# Patient Record
Sex: Male | Born: 1935 | Hispanic: Yes | Smoking: Former smoker
Health system: Southern US, Community
[De-identification: ages and names within clinical notes are randomized; demographics above are authoritative.]

## PROBLEM LIST (undated history)

## (undated) DIAGNOSIS — R252 Cramp and spasm: Secondary | ICD-10-CM

## (undated) DIAGNOSIS — G5600 Carpal tunnel syndrome, unspecified upper limb: Secondary | ICD-10-CM

## (undated) DIAGNOSIS — I739 Peripheral vascular disease, unspecified: Secondary | ICD-10-CM

## (undated) DIAGNOSIS — M4802 Spinal stenosis, cervical region: Secondary | ICD-10-CM

## (undated) DIAGNOSIS — I10 Essential (primary) hypertension: Secondary | ICD-10-CM

## (undated) DIAGNOSIS — M1990 Unspecified osteoarthritis, unspecified site: Secondary | ICD-10-CM

## (undated) DIAGNOSIS — E785 Hyperlipidemia, unspecified: Secondary | ICD-10-CM

## (undated) DIAGNOSIS — E119 Type 2 diabetes mellitus without complications: Secondary | ICD-10-CM

## (undated) DIAGNOSIS — F329 Major depressive disorder, single episode, unspecified: Secondary | ICD-10-CM

## (undated) DIAGNOSIS — R269 Unspecified abnormalities of gait and mobility: Secondary | ICD-10-CM

## (undated) DIAGNOSIS — F32A Depression, unspecified: Secondary | ICD-10-CM

## (undated) DIAGNOSIS — G709 Myoneural disorder, unspecified: Secondary | ICD-10-CM

## (undated) DIAGNOSIS — T4145XA Adverse effect of unspecified anesthetic, initial encounter: Secondary | ICD-10-CM

## (undated) DIAGNOSIS — I2510 Atherosclerotic heart disease of native coronary artery without angina pectoris: Secondary | ICD-10-CM

## (undated) HISTORY — PX: SHOULDER OPEN ROTATOR CUFF REPAIR: SHX2407

## (undated) HISTORY — DX: Unspecified abnormalities of gait and mobility: R26.9

## (undated) HISTORY — DX: Carpal tunnel syndrome, unspecified upper limb: G56.00

## (undated) HISTORY — DX: Atherosclerotic heart disease of native coronary artery without angina pectoris: I25.10

## (undated) HISTORY — DX: Spinal stenosis, cervical region: M48.02

## (undated) HISTORY — PX: CARPAL TUNNEL RELEASE: SHX101

## (undated) HISTORY — PX: VASCULAR SURGERY: SHX849

## (undated) HISTORY — DX: Hyperlipidemia, unspecified: E78.5

## (undated) HISTORY — DX: Cramp and spasm: R25.2

## (undated) HISTORY — PX: CATARACT EXTRACTION: SUR2

## (undated) HISTORY — DX: Peripheral vascular disease, unspecified: I73.9

---

## 2003-10-16 HISTORY — PX: FEMORAL BYPASS: SHX50

## 2010-01-01 ENCOUNTER — Encounter: Admission: RE | Admit: 2010-01-01 | Discharge: 2010-01-01 | Payer: Self-pay | Admitting: Orthopedic Surgery

## 2010-10-18 ENCOUNTER — Encounter
Admission: RE | Admit: 2010-10-18 | Discharge: 2010-10-18 | Payer: Self-pay | Source: Home / Self Care | Attending: Orthopedic Surgery | Admitting: Orthopedic Surgery

## 2011-05-22 ENCOUNTER — Other Ambulatory Visit (HOSPITAL_COMMUNITY): Payer: Self-pay | Admitting: Family Medicine

## 2011-05-22 DIAGNOSIS — I679 Cerebrovascular disease, unspecified: Secondary | ICD-10-CM

## 2011-05-23 ENCOUNTER — Other Ambulatory Visit: Payer: Self-pay | Admitting: Family Medicine

## 2011-05-23 ENCOUNTER — Ambulatory Visit
Admission: RE | Admit: 2011-05-23 | Discharge: 2011-05-23 | Disposition: A | Discharge status: Home / Self Care | Payer: Medicare Other | Source: Ambulatory Visit | Attending: Family Medicine | Admitting: Family Medicine

## 2011-05-23 VITALS — BP 177/81 | HR 64 | Temp 97.8°F | Resp 16 | Ht 68.0 in | Wt 160.0 lb

## 2011-05-23 DIAGNOSIS — M79604 Pain in right leg: Secondary | ICD-10-CM

## 2011-05-23 DIAGNOSIS — I739 Peripheral vascular disease, unspecified: Secondary | ICD-10-CM

## 2011-05-23 HISTORY — DX: Peripheral vascular disease, unspecified: I73.9
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
## 2011-08-07 ENCOUNTER — Other Ambulatory Visit: Payer: Self-pay | Admitting: Neurosurgery

## 2011-08-07 DIAGNOSIS — M542 Cervicalgia: Secondary | ICD-10-CM

## 2011-08-11 ENCOUNTER — Ambulatory Visit
Admission: RE | Admit: 2011-08-11 | Discharge: 2011-08-11 | Disposition: A | Discharge status: Home / Self Care | Payer: Medicare Other | Source: Ambulatory Visit | Attending: Neurosurgery | Admitting: Neurosurgery

## 2011-08-11 DIAGNOSIS — M542 Cervicalgia: Secondary | ICD-10-CM

## 2011-08-21 ENCOUNTER — Encounter (HOSPITAL_COMMUNITY): Payer: Self-pay | Admitting: Pharmacy Technician

## 2011-08-22 ENCOUNTER — Encounter (HOSPITAL_COMMUNITY)
Admission: RE | Admit: 2011-08-22 | Discharge: 2011-08-22 | Disposition: A | Discharge status: Home / Self Care | Payer: Medicare Other | Source: Ambulatory Visit | Attending: Neurosurgery | Admitting: Neurosurgery

## 2011-08-22 ENCOUNTER — Encounter (HOSPITAL_COMMUNITY): Payer: Self-pay

## 2011-08-22 HISTORY — DX: Myoneural disorder, unspecified: G70.9

## 2011-08-22 HISTORY — DX: Depression, unspecified: F32.A

## 2011-08-22 HISTORY — DX: Essential (primary) hypertension: I10

## 2011-08-22 HISTORY — DX: Major depressive disorder, single episode, unspecified: F32.9

## 2011-08-22 LAB — CBC
MCH: 31.6 pg (ref 26.0–34.0)
MCHC: 34.6 g/dL (ref 30.0–36.0)
MCV: 91.5 fL (ref 78.0–100.0)
Platelets: 280 10*3/uL (ref 150–400)
RBC: 4.14 MIL/uL — ABNORMAL LOW (ref 4.22–5.81)

## 2011-08-22 LAB — BASIC METABOLIC PANEL
CO2: 30 mEq/L (ref 19–32)
Calcium: 10 mg/dL (ref 8.4–10.5)
Creatinine, Ser: 0.98 mg/dL (ref 0.50–1.35)
GFR calc non Af Amer: 72 mL/min — ABNORMAL LOW (ref >90)
Glucose, Bld: 98 mg/dL (ref 70–99)
Sodium: 136 mEq/L (ref 135–145)

## 2011-08-22 LAB — SURGICAL PCR SCREEN: Staphylococcus aureus: NEGATIVE

## 2011-08-22 NOTE — Progress Notes (Signed)
REQUESTED OV NOTE EKG DR HODGES [phone].NO STRESS/ECHO @ [facility].NO CXR PAST 12 MTHS.NEEDS CXR DOS

## 2011-08-22 NOTE — Pre-Procedure Instructions (Signed)
20 Anthony Marsh  08/22/2011   Your procedure is scheduled on: NOV 9 Report to [facility] at 0530 AM.  Call this number if you have problems the morning of surgery: [phone]   Remember:   Do not eat food:After Midnight.  Do not drink clear liquids: 4 Hours before arrival.  Take these medicines the morning of surgery with A SIP OF WATER: ZOLOFT,CARVEDILOL   Do not wear jewelry, make-up or nail polish.  Do not wear lotions, powders, or perfumes. You may wear deodorant.  Do not shave 48 hours prior to surgery.  Do not bring valuables to the hospital.  Contacts, dentures or bridgework may not be worn into surgery.  Leave suitcase in the car. After surgery it may be brought to your room.  For patients admitted to the hospital, checkout time is 11:00 AM the day of discharge.   Patients discharged the day of surgery will not be allowed to drive home.  Name and phone number of your driver: FAMILY\  Special Instructions: CHG Shower Use Special Wash: 1/2 bottle night before surgery and 1/2 bottle morning of surgery.   Please read over the following fact sheets that you were given: Pain Booklet, Coughing and Deep Breathing, MRSA Information and Surgical Site Infection Prevention

## 2011-08-23 MED ORDER — CEFAZOLIN SODIUM 1-5 GM-% IV SOLN: 1.0000 g | INTRAVENOUS | Status: DC

## 2011-08-23 MED ORDER — CEFAZOLIN SODIUM 1-5 GM-% IV SOLN
1.0000 g | INTRAVENOUS | Status: DC
  Filled 2011-08-23: qty 50

## 2011-08-24 ENCOUNTER — Encounter (HOSPITAL_COMMUNITY): Payer: Self-pay | Admitting: Anesthesiology

## 2011-08-24 ENCOUNTER — Inpatient Hospital Stay (HOSPITAL_COMMUNITY)
Admission: RE | Admit: 2011-08-24 | Discharge: 2011-08-26 | DRG: 473 | Disposition: A | Discharge status: Home / Self Care | Payer: Medicare Other | Source: Ambulatory Visit | Attending: Neurosurgery | Admitting: Neurosurgery

## 2011-08-24 ENCOUNTER — Inpatient Hospital Stay (HOSPITAL_COMMUNITY): Payer: Medicare Other | Admitting: Anesthesiology

## 2011-08-24 ENCOUNTER — Encounter (HOSPITAL_COMMUNITY)
Admission: RE | Disposition: A | Discharge status: Home / Self Care | Payer: Self-pay | Source: Ambulatory Visit | Attending: Neurosurgery

## 2011-08-24 ENCOUNTER — Inpatient Hospital Stay (HOSPITAL_COMMUNITY): Payer: Medicare Other

## 2011-08-24 ENCOUNTER — Other Ambulatory Visit: Payer: Self-pay

## 2011-08-24 ENCOUNTER — Encounter (HOSPITAL_COMMUNITY): Payer: Self-pay | Admitting: *Deleted

## 2011-08-24 DIAGNOSIS — E119 Type 2 diabetes mellitus without complications: Secondary | ICD-10-CM | POA: Diagnosis present

## 2011-08-24 DIAGNOSIS — F329 Major depressive disorder, single episode, unspecified: Secondary | ICD-10-CM | POA: Diagnosis present

## 2011-08-24 DIAGNOSIS — I739 Peripheral vascular disease, unspecified: Secondary | ICD-10-CM | POA: Diagnosis present

## 2011-08-24 DIAGNOSIS — I10 Essential (primary) hypertension: Secondary | ICD-10-CM | POA: Diagnosis present

## 2011-08-24 DIAGNOSIS — M4712 Other spondylosis with myelopathy, cervical region: Principal | ICD-10-CM | POA: Diagnosis present

## 2011-08-24 DIAGNOSIS — F3289 Other specified depressive episodes: Secondary | ICD-10-CM | POA: Diagnosis present

## 2011-08-24 DIAGNOSIS — Z01812 Encounter for preprocedural laboratory examination: Secondary | ICD-10-CM

## 2011-08-24 HISTORY — PX: ANTERIOR CERVICAL DECOMP/DISCECTOMY FUSION: SHX1161

## 2011-08-24 LAB — GLUCOSE, CAPILLARY: Glucose-Capillary: 154 mg/dL — ABNORMAL HIGH (ref 70–99)

## 2011-08-24 SURGERY — ANTERIOR CERVICAL DECOMPRESSION/DISCECTOMY FUSION 3 LEVELS
Anesthesia: General | Wound class: Clean

## 2011-08-24 MED ORDER — ZOLPIDEM TARTRATE 5 MG PO TABS: 5.0000 mg | ORAL_TABLET | Freq: Every evening | ORAL | Status: DC | PRN

## 2011-08-24 MED ORDER — DOCUSATE SODIUM 100 MG PO CAPS
100.0000 mg | ORAL_CAPSULE | Freq: Two times a day (BID) | ORAL | Status: DC
  Administered 2011-08-24 – 2011-08-26 (×4): 100 mg via ORAL
  Filled 2011-08-24 (×3): qty 1

## 2011-08-24 MED ORDER — HYDROMORPHONE HCL PF 1 MG/ML IJ SOLN: 0.2500 mg | INTRAMUSCULAR | Status: DC | PRN

## 2011-08-24 MED ORDER — ONDANSETRON HCL 4 MG/2ML IJ SOLN
4.0000 mg | INTRAMUSCULAR | Status: DC | PRN
  Administered 2011-08-24: 4 mg via INTRAVENOUS
  Filled 2011-08-24: qty 2

## 2011-08-24 MED ORDER — FENTANYL CITRATE 0.05 MG/ML IJ SOLN
INTRAMUSCULAR | Status: DC | PRN
  Administered 2011-08-24: 100 ug via INTRAVENOUS
  Administered 2011-08-24 (×2): 50 ug via INTRAVENOUS

## 2011-08-24 MED ORDER — CEFAZOLIN SODIUM 1-5 GM-% IV SOLN
INTRAVENOUS | Status: DC | PRN
  Administered 2011-08-24: 1 g via INTRAVENOUS

## 2011-08-24 MED ORDER — PROPOFOL 10 MG/ML IV EMUL
INTRAVENOUS | Status: DC | PRN
  Administered 2011-08-24: 200 mg via INTRAVENOUS

## 2011-08-24 MED ORDER — ACETAMINOPHEN 650 MG RE SUPP: 650.0000 mg | RECTAL | Status: DC | PRN

## 2011-08-24 MED ORDER — PHENOL 1.4 % MT LIQD
1.0000 | OROMUCOSAL | Status: DC | PRN
  Administered 2011-08-24: 1 via OROMUCOSAL
  Filled 2011-08-24: qty 177

## 2011-08-24 MED ORDER — GLYCOPYRROLATE 0.2 MG/ML IJ SOLN
INTRAMUSCULAR | Status: DC | PRN
  Administered 2011-08-24: 0.2 mg via INTRAVENOUS

## 2011-08-24 MED ORDER — PHENYLEPHRINE HCL 10 MG/ML IJ SOLN
10000.0000 ug | INTRAMUSCULAR | Status: DC | PRN
  Administered 2011-08-24: 10 ug/min via INTRAVENOUS

## 2011-08-24 MED ORDER — THROMBIN 5000 UNITS EX KIT
PACK | OROMUCOSAL | Status: DC | PRN
  Administered 2011-08-24: 15:00:00 via TOPICAL

## 2011-08-24 MED ORDER — SERTRALINE HCL 50 MG PO TABS
50.0000 mg | ORAL_TABLET | Freq: Every day | ORAL | Status: DC
  Administered 2011-08-25 – 2011-08-26 (×2): 50 mg via ORAL
  Filled 2011-08-24 (×3): qty 1

## 2011-08-24 MED ORDER — MORPHINE SULFATE 4 MG/ML IJ SOLN
1.0000 mg | INTRAMUSCULAR | Status: DC | PRN
  Administered 2011-08-24: 4 mg via INTRAVENOUS
  Administered 2011-08-24 – 2011-08-25 (×2): 2 mg via INTRAVENOUS
  Filled 2011-08-24 (×3): qty 1

## 2011-08-24 MED ORDER — DEXAMETHASONE SODIUM PHOSPHATE 4 MG/ML IJ SOLN: 4.0000 mg | Freq: Four times a day (QID) | INTRAMUSCULAR | Status: DC

## 2011-08-24 MED ORDER — ONDANSETRON HCL 4 MG/2ML IJ SOLN: 4.0000 mg | Freq: Once | INTRAMUSCULAR | Status: DC | PRN

## 2011-08-24 MED ORDER — DEXAMETHASONE SODIUM PHOSPHATE 4 MG/ML IJ SOLN
4.0000 mg | Freq: Four times a day (QID) | INTRAMUSCULAR | Status: DC
  Administered 2011-08-24 – 2011-08-26 (×5): 4 mg via INTRAVENOUS
  Filled 2011-08-24 (×7): qty 1

## 2011-08-24 MED ORDER — GELATIN ABSORBABLE 100 EX MISC
CUTANEOUS | Status: DC | PRN
  Administered 2011-08-24: 13:00:00 via TOPICAL

## 2011-08-24 MED ORDER — CARVEDILOL 6.25 MG PO TABS
6.2500 mg | ORAL_TABLET | Freq: Two times a day (BID) | ORAL | Status: DC
  Administered 2011-08-24 – 2011-08-26 (×4): 6.25 mg via ORAL
  Filled 2011-08-24 (×6): qty 1

## 2011-08-24 MED ORDER — ACETAMINOPHEN 325 MG PO TABS: 650.0000 mg | ORAL_TABLET | ORAL | Status: DC | PRN

## 2011-08-24 MED ORDER — CEFAZOLIN SODIUM 1-5 GM-% IV SOLN
1.0000 g | Freq: Three times a day (TID) | INTRAVENOUS | Status: AC
  Administered 2011-08-24 (×2): 1 g via INTRAVENOUS
  Filled 2011-08-24: qty 50

## 2011-08-24 MED ORDER — ROCURONIUM BROMIDE 100 MG/10ML IV SOLN
INTRAVENOUS | Status: DC | PRN
  Administered 2011-08-24: 10 mg via INTRAVENOUS
  Administered 2011-08-24: 50 mg via INTRAVENOUS

## 2011-08-24 MED ORDER — ALUM & MAG HYDROXIDE-SIMETH 400-400-40 MG/5ML PO SUSP
30.0000 mL | Freq: Four times a day (QID) | ORAL | Status: DC | PRN
  Filled 2011-08-24: qty 30

## 2011-08-24 MED ORDER — DEXAMETHASONE 4 MG PO TABS: 4.0000 mg | ORAL_TABLET | Freq: Four times a day (QID) | ORAL | Status: DC

## 2011-08-24 MED ORDER — HYDROCHLOROTHIAZIDE 50 MG PO TABS
50.0000 mg | ORAL_TABLET | Freq: Every day | ORAL | Status: DC
  Administered 2011-08-24 – 2011-08-26 (×3): 50 mg via ORAL
  Filled 2011-08-24 (×3): qty 1

## 2011-08-24 MED ORDER — LACTATED RINGERS IV SOLN
INTRAVENOUS | Status: DC | PRN
  Administered 2011-08-24 (×4): via INTRAVENOUS

## 2011-08-24 MED ORDER — HYDROCODONE-ACETAMINOPHEN 5-325 MG PO TABS
1.0000 | ORAL_TABLET | ORAL | Status: DC | PRN
  Administered 2011-08-24 – 2011-08-26 (×5): 2 via ORAL
  Filled 2011-08-24 (×5): qty 2

## 2011-08-24 MED ORDER — ONDANSETRON HCL 4 MG/2ML IJ SOLN
INTRAMUSCULAR | Status: DC | PRN
  Administered 2011-08-24: 4 mg via INTRAVENOUS

## 2011-08-24 MED ORDER — DEXAMETHASONE 4 MG PO TABS
4.0000 mg | ORAL_TABLET | Freq: Four times a day (QID) | ORAL | Status: DC
  Administered 2011-08-25 (×2): 4 mg via ORAL
  Filled 2011-08-24 (×7): qty 1

## 2011-08-24 MED ORDER — SODIUM CHLORIDE 0.9 % IR SOLN
Status: DC | PRN
  Administered 2011-08-24: 1000 mL

## 2011-08-24 MED ORDER — POTASSIUM CHLORIDE IN NACL 20-0.9 MEQ/L-% IV SOLN
INTRAVENOUS | Status: DC
  Administered 2011-08-24: 18:00:00 via INTRAVENOUS
  Filled 2011-08-24 (×6): qty 1000

## 2011-08-24 MED ORDER — MENTHOL 3 MG MT LOZG
1.0000 | LOZENGE | OROMUCOSAL | Status: DC | PRN
  Filled 2011-08-24: qty 9

## 2011-08-24 MED ORDER — ALUM & MAG HYDROXIDE-SIMETH 200-200-20 MG/5ML PO SUSP: 30.0000 mL | Freq: Four times a day (QID) | ORAL | Status: DC | PRN

## 2011-08-24 SURGICAL SUPPLY — 67 items
BANDAGE GAUZE ELAST BULKY 4 IN (GAUZE/BANDAGES/DRESSINGS) ×4 IMPLANT
BENZOIN TINCTURE PRP APPL 2/3 (GAUZE/BANDAGES/DRESSINGS) ×2 IMPLANT
BIT DRILL SM SPINE QC 12 (BIT) ×2 IMPLANT
BLADE ULTRA TIP 2M (BLADE) ×2 IMPLANT
BUR BARREL STRAIGHT FLUTE 4.0 (BURR) IMPLANT
BUR MATCHSTICK NEURO 3.0 LAGG (BURR) ×2 IMPLANT
CANISTER SUCTION 2500CC (MISCELLANEOUS) ×2 IMPLANT
CLOSURE STERI STRIP 1/2 X4 (GAUZE/BANDAGES/DRESSINGS) ×2 IMPLANT
CLOTH BEACON ORANGE TIMEOUT ST (SAFETY) ×2 IMPLANT
CONT SPEC 4OZ CLIKSEAL STRL BL (MISCELLANEOUS) ×2 IMPLANT
COVER MAYO STAND STRL (DRAPES) ×2 IMPLANT
DRAPE LAPAROTOMY 100X72 PEDS (DRAPES) ×2 IMPLANT
DRAPE MICROSCOPE LEICA (MISCELLANEOUS) ×2 IMPLANT
DRAPE POUCH INSTRU U-SHP 10X18 (DRAPES) ×2 IMPLANT
DRAPE PROXIMA HALF (DRAPES) IMPLANT
DURAPREP 6ML APPLICATOR 50/CS (WOUND CARE) ×2 IMPLANT
ELECT REM PT RETURN 9FT ADLT (ELECTROSURGICAL) ×2
ELECTRODE REM PT RTRN 9FT ADLT (ELECTROSURGICAL) ×1 IMPLANT
GAUZE SPONGE 4X4 16PLY XRAY LF (GAUZE/BANDAGES/DRESSINGS) IMPLANT
GLOVE BIO SURGEON STRL SZ 6.5 (GLOVE) IMPLANT
GLOVE BIO SURGEON STRL SZ7 (GLOVE) IMPLANT
GLOVE BIO SURGEON STRL SZ7.5 (GLOVE) IMPLANT
GLOVE BIO SURGEON STRL SZ8 (GLOVE) IMPLANT
GLOVE BIO SURGEON STRL SZ8.5 (GLOVE) IMPLANT
GLOVE BIOGEL M 8.0 STRL (GLOVE) ×2 IMPLANT
GLOVE ECLIPSE 6.5 STRL STRAW (GLOVE) IMPLANT
GLOVE ECLIPSE 7.0 STRL STRAW (GLOVE) IMPLANT
GLOVE ECLIPSE 7.5 STRL STRAW (GLOVE) ×2 IMPLANT
GLOVE ECLIPSE 8.0 STRL XLNG CF (GLOVE) IMPLANT
GLOVE ECLIPSE 8.5 STRL (GLOVE) IMPLANT
GLOVE EXAM NITRILE LRG STRL (GLOVE) IMPLANT
GLOVE EXAM NITRILE MD LF STRL (GLOVE) IMPLANT
GLOVE EXAM NITRILE XL STR (GLOVE) IMPLANT
GLOVE EXAM NITRILE XS STR PU (GLOVE) IMPLANT
GLOVE INDICATOR 6.5 STRL GRN (GLOVE) IMPLANT
GLOVE INDICATOR 7.0 STRL GRN (GLOVE) IMPLANT
GLOVE INDICATOR 7.5 STRL GRN (GLOVE) IMPLANT
GLOVE INDICATOR 8.0 STRL GRN (GLOVE) IMPLANT
GLOVE INDICATOR 8.5 STRL (GLOVE) IMPLANT
GLOVE OPTIFIT SS 8.0 STRL (GLOVE) IMPLANT
GLOVE OPTIFIT SS STER SZ 7 (GLOVE) IMPLANT
GLOVE SURG SS PI 6.5 STRL IVOR (GLOVE) IMPLANT
GOWN BRE IMP SLV AUR LG STRL (GOWN DISPOSABLE) ×4 IMPLANT
GOWN BRE IMP SLV AUR XL STRL (GOWN DISPOSABLE) ×2 IMPLANT
GOWN STRL REIN 2XL LVL4 (GOWN DISPOSABLE) IMPLANT
KIT BASIN OR (CUSTOM PROCEDURE TRAY) ×2 IMPLANT
KIT ROOM TURNOVER OR (KITS) ×2 IMPLANT
NEEDLE SPNL 22GX3.5 QUINCKE BK (NEEDLE) ×2 IMPLANT
NS IRRIG 1000ML POUR BTL (IV SOLUTION) ×2 IMPLANT
PACK LAMINECTOMY NEURO (CUSTOM PROCEDURE TRAY) ×2 IMPLANT
PATTIES SURGICAL .5 X1 (DISPOSABLE) ×2 IMPLANT
PLATE ANT CERV XTEND 3 LV 45 (Plate) ×2 IMPLANT
PUTTY DBX 1CC (Putty) ×2 IMPLANT
PUTTY DBX 1CC DEPUY (Putty) ×1 IMPLANT
RUBBERBAND STERILE (MISCELLANEOUS) ×4 IMPLANT
SCREW XTD VAR 4.2 SELF TAP 12 (Screw) ×2 IMPLANT
SPACER ACDF SM LORDOTIC 7 (Spacer) ×2 IMPLANT
SPONGE GAUZE 4X4 12PLY (GAUZE/BANDAGES/DRESSINGS) ×2 IMPLANT
SPONGE INTESTINAL PEANUT (DISPOSABLE) ×4 IMPLANT
SPONGE SURGIFOAM ABS GEL 100 (HEMOSTASIS) ×2 IMPLANT
STRIP CLOSURE SKIN 1/2X4 (GAUZE/BANDAGES/DRESSINGS) ×2 IMPLANT
SUT VIC AB 3-0 SH 8-18 (SUTURE) ×4 IMPLANT
SYR 20ML ECCENTRIC (SYRINGE) ×2 IMPLANT
TAPE HYPAFIX 4 X10 (GAUZE/BANDAGES/DRESSINGS) ×2 IMPLANT
TOWEL OR 17X24 6PK STRL BLUE (TOWEL DISPOSABLE) ×2 IMPLANT
TOWEL OR 17X26 10 PK STRL BLUE (TOWEL DISPOSABLE) ×2 IMPLANT
WATER STERILE IRR 1000ML POUR (IV SOLUTION) ×2 IMPLANT

## 2011-08-24 NOTE — Transfer of Care (Signed)
Immediate Anesthesia Transfer of Care Note  Patient: Anthony Marsh  Procedure(s) Performed:  ANTERIOR CERVICAL DECOMPRESSION/DISCECTOMY FUSION 3 LEVELS - C4-5, C5-6, C6-7 ANTERIOR CERVICAL DISKECTOMY, FUSION, PLATE  Patient Location: PACU  Anesthesia Type: General  Level of Consciousness: awake, alert , oriented, sedated and responds to stimulation  Airway & Oxygen Therapy: Patient Spontanous Breathing and Patient connected to face mask  Post-op Assessment: Report given to PACU RN, Post -op Vital signs reviewed and stable, Patient moving all extremities X 4 and Patient able to stick tongue midline  Post vital signs: Reviewed and stable  Complications: No apparent anesthesia complications

## 2011-08-24 NOTE — Anesthesia Preprocedure Evaluation (Signed)
Anesthesia Evaluation  Patient identified by MRN, date of birth, ID band Patient awake    Reviewed: Allergy & Precautions, H&P , NPO status , Patient's Chart, lab work & pertinent test results  Airway Mallampati: II TM Distance: >3 FB Neck ROM: limited    Dental  (+) Poor Dentition and Chipped   Pulmonary neg pulmonary ROS,    Pulmonary exam normal       Cardiovascular Exercise Tolerance: Good hypertension, + Peripheral Vascular Disease regular Normal    Neuro/Psych PSYCHIATRIC DISORDERS  Neuromuscular disease    GI/Hepatic negative GI ROS, Neg liver ROS,   Endo/Other  Diabetes mellitus-, Well Controlled, Type 2  Renal/GU   Genitourinary negative   Musculoskeletal   Abdominal   Peds  Hematology negative hematology ROS (+)   Anesthesia Other Findings   Reproductive/Obstetrics                           Anesthesia Physical Anesthesia Plan  ASA: III  Anesthesia Plan: General   Post-op Pain Management:    Induction: Intravenous  Airway Management Planned: Oral ETT  Additional Equipment:   Intra-op Plan:   Post-operative Plan: Extubation in OR  Informed Consent: I have reviewed the patients History and Physical, chart, labs and discussed the procedure including the risks, benefits and alternatives for the proposed anesthesia with the patient or authorized representative who has indicated his/her understanding and acceptance.   Dental advisory given  Plan Discussed with: CRNA, Anesthesiologist and Surgeon  Anesthesia Plan Comments:         Anesthesia Quick Evaluation
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
## 2011-08-24 NOTE — Anesthesia Postprocedure Evaluation (Signed)
  Anesthesia Post-op Note  Patient: Anthony Marsh  Procedure(s) Performed:  ANTERIOR CERVICAL DECOMPRESSION/DISCECTOMY FUSION 3 LEVELS - C4-5, C5-6, C6-7 ANTERIOR CERVICAL DISKECTOMY, FUSION, PLATE  Patient Location: PACU  Anesthesia Type: General  Level of Consciousness: awake, oriented and sedated  Airway and Oxygen Therapy: Patient Spontanous Breathing and Patient connected to nasal cannula oxygen  Post-op Pain: mild  Post-op Assessment: Post-op Vital signs reviewed, Patient's Cardiovascular Status Stable, Respiratory Function Stable, Patent Airway, No signs of Nausea or vomiting and Pain level controlled  Post-op Vital Signs: stable  Complications: No apparent anesthesia complications

## 2011-08-24 NOTE — Progress Notes (Signed)
Orthopedic Tech Progress Note Patient Details:  Anthony Marsh 29-Mar-1923 [id number]  Other Ortho Devices Ortho Device Location: soft collar Ortho Device Interventions: Application   Jennye Moccasin 08/24/2011, 4:13 PM

## 2011-08-24 NOTE — Brief Op Note (Signed)
08/24/2011  3:28 PM  PATIENT:  Anthony Marsh  77 y.o. male  PRE-OPERATIVE DIAGNOSIS:  CERVICAL SPONDYLOSIS WITH MYELOPATHY, CERVICAL STENOSIS  POST-OPERATIVE DIAGNOSIS:  cervical spondylosis with myelopathy, cervical stenosis  PROCEDURE:  Procedure(s): ANTERIOR CERVICAL DECOMPRESSION/DISCECTOMY FUSION 3 LEVELS  SURGEON:  Surgeon(s): Lynne Logan M Alayah Knouff Clydene Fake  PHYSICIAN ASSISTANT:   ASSISTANTS:   ANESTHESIA:   general  EBL:  Total I/O In: 2000 [I.V.:2000] Out: 200 [Blood:200]  BLOOD ADMINISTERED:none  DRAINSJackson-Pratt drain(s) with closed bulb suction in the neck   LOCAL MEDICATIONS USED:  NONE  SPECIMEN:  No Specimen  DISPOSITION OF SPECIMEN:  N/A  COUNTS:  YES  TOURNIQUET:  * No tourniquets in log *  DICTATION: .Other Dictation: Dictation Number [id number]  PLAN OF CARE: Admit to inpatient   PATIENT DISPOSITION:  PACU - hemodynamically stable.   Delay start of Pharmacological VTE agent (>24hrs) due to surgical blood loss or risk of bleeding:  NO

## 2011-08-25 LAB — GLUCOSE, CAPILLARY
Glucose-Capillary: 124 mg/dL — ABNORMAL HIGH (ref 70–99)
Glucose-Capillary: 166 mg/dL — ABNORMAL HIGH (ref 70–99)
Glucose-Capillary: 144 mg/dL — ABNORMAL HIGH (ref 70–99)
Glucose-Capillary: 159 mg/dL — ABNORMAL HIGH (ref 70–99)

## 2011-08-25 MED ORDER — SODIUM CHLORIDE 0.9 % IJ SOLN: 3.0000 mL | INTRAMUSCULAR | Status: DC | PRN

## 2011-08-25 NOTE — Progress Notes (Signed)
Stable,right deltoids 0/5. No pain. jp drain working well. Swallows well. Incision dry. Seen by pt/ot. Less imbalance. Decrease of burning sensation in both arms.
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
## 2011-08-25 NOTE — Progress Notes (Signed)
  Doing well. C/o appropriate incisional soreness. No arm pain No Numbness, tingling,- stable decrease ROM Right arm No Nausea /vomiting Amb/ voiding well  Temp:  [97 F (36.1 C)-97.8 F (36.6 C)] 97.5 F (36.4 C) (11/10 0600) Pulse Rate:  [58-85] 66  (11/10 0600) Resp:  [9-24] 20  (11/10 0600) BP: (120-186)/(57-72) 124/62 mmHg (11/10 0600) SpO2:  [96 %-100 %] 96 % (11/10 0600) Weight:  [71.215 kg (157 lb)] 157 lb (71.215 kg) (11/09 1722) Good strength and sensation Incision CDI Drain - 130 cc last 12 hours with sig drainage in drain now  Plan: Keep drain another day
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
## 2011-08-26 MED ORDER — HYDROCODONE-ACETAMINOPHEN 5-325 MG PO TABS: 1.0000 | ORAL_TABLET | ORAL | Status: DC | PRN

## 2011-08-26 NOTE — Discharge Summary (Signed)
Physician Discharge Summary  Patient ID: Anthony Marsh MRN: [id number] DOB/AGE: 75-Jan-1924 75 y.o.  Admit date: 08/24/2011 Discharge date: 08/26/2011  Admission Diagnoses:  Discharge Diagnoses:  Active Problems:  * No active hospital problems. *    Discharged Condition: good  Hospital Course: Doing well. C/o appropriate incisional soreness.  No arm pain  No Numbness, tingling,- stable decrease ROM Right arm  No Nausea /vomiting  Amb/ voiding well Drain removed.   Consults: none  Significant Diagnostic Studies: none  Treatments: surgery: C45,66 decompresin and fusion with cages and plate   Discharge Exam: Blood pressure 157/53, pulse 61, temperature 97.4 F (36.3 C), temperature source Oral, resp. rate 18, height 5\' 8"  (1.727 m), weight 71.215 kg (157 lb), SpO2 98.00%. Wound:c/d/i  Disposition: home   Current Discharge Medication List    START taking these medications   Details  HYDROcodone-acetaminophen (NORCO) 5-325 MG per tablet Take 1-2 tablets by mouth every 4 (four) hours as needed. Qty: 41 tablet, Refills: 0      CONTINUE these medications which have NOT CHANGED   Details  carvedilol (COREG) 6.25 MG tablet Take 6.25 mg by mouth 2 (two) times daily with a meal. Pt takes this medication at 8 am and 8 pm     sertraline (ZOLOFT) 100 MG tablet Take 50 mg by mouth daily.      cholecalciferol (VITAMIN D) 1000 UNITS tablet Take 1,000 Units by mouth daily.      clopidogrel (PLAVIX) 75 MG tablet Take 75 mg by mouth daily.      hydrochlorothiazide 50 MG tablet Take 50 mg by mouth daily.      Multiple Vitamins-Minerals (MULTIVITAMINS THER. W/MINERALS) TABS Take 1 tablet by mouth daily.      simvastatin (ZOCOR) 40 MG tablet Take 40 mg by mouth at bedtime.     vitamin C (ASCORBIC ACID) 500 MG tablet Take 500 mg by mouth daily.           Signed: Clydene Fake, MD 08/26/2011, 9:25 AM

## 2011-08-26 NOTE — Progress Notes (Signed)
Pt discharged home in stable condition with all belongings and family. Medications and discharge instructions reviewed prior to discharge.  

## 2011-08-27 ENCOUNTER — Other Ambulatory Visit: Payer: Self-pay | Admitting: Neurological Surgery

## 2011-08-27 ENCOUNTER — Encounter (HOSPITAL_COMMUNITY): Payer: Self-pay | Admitting: *Deleted

## 2011-08-27 ENCOUNTER — Inpatient Hospital Stay (HOSPITAL_COMMUNITY)
Admission: EM | Admit: 2011-08-27 | Discharge: 2011-08-30 | DRG: 081 | Disposition: A | Discharge status: Home / Self Care | Payer: Medicare Other | Source: Other Acute Inpatient Hospital | Attending: Neurosurgery | Admitting: Neurosurgery

## 2011-08-27 ENCOUNTER — Inpatient Hospital Stay (HOSPITAL_COMMUNITY): Payer: Medicare Other

## 2011-08-27 DIAGNOSIS — E876 Hypokalemia: Secondary | ICD-10-CM | POA: Diagnosis present

## 2011-08-27 DIAGNOSIS — F3289 Other specified depressive episodes: Secondary | ICD-10-CM | POA: Diagnosis present

## 2011-08-27 DIAGNOSIS — E785 Hyperlipidemia, unspecified: Secondary | ICD-10-CM | POA: Diagnosis present

## 2011-08-27 DIAGNOSIS — Z7902 Long term (current) use of antithrombotics/antiplatelets: Secondary | ICD-10-CM

## 2011-08-27 DIAGNOSIS — R410 Disorientation, unspecified: Secondary | ICD-10-CM | POA: Diagnosis present

## 2011-08-27 DIAGNOSIS — R404 Transient alteration of awareness: Principal | ICD-10-CM | POA: Diagnosis present

## 2011-08-27 DIAGNOSIS — Z79899 Other long term (current) drug therapy: Secondary | ICD-10-CM

## 2011-08-27 DIAGNOSIS — F329 Major depressive disorder, single episode, unspecified: Secondary | ICD-10-CM | POA: Diagnosis present

## 2011-08-27 DIAGNOSIS — Z87891 Personal history of nicotine dependence: Secondary | ICD-10-CM

## 2011-08-27 DIAGNOSIS — I739 Peripheral vascular disease, unspecified: Secondary | ICD-10-CM | POA: Insufficient documentation

## 2011-08-27 DIAGNOSIS — I10 Essential (primary) hypertension: Secondary | ICD-10-CM | POA: Diagnosis present

## 2011-08-27 DIAGNOSIS — E860 Dehydration: Secondary | ICD-10-CM | POA: Diagnosis present

## 2011-08-27 DIAGNOSIS — Z794 Long term (current) use of insulin: Secondary | ICD-10-CM

## 2011-08-27 DIAGNOSIS — M4802 Spinal stenosis, cervical region: Secondary | ICD-10-CM | POA: Insufficient documentation

## 2011-08-27 DIAGNOSIS — E119 Type 2 diabetes mellitus without complications: Secondary | ICD-10-CM | POA: Diagnosis present

## 2011-08-27 LAB — COMPREHENSIVE METABOLIC PANEL
ALT: 15 U/L (ref 0–53)
AST: 34 U/L (ref 0–37)
Albumin: 3.3 g/dL — ABNORMAL LOW (ref 3.5–5.2)
Alkaline Phosphatase: 60 U/L (ref 39–117)
Calcium: 9.2 mg/dL (ref 8.4–10.5)
GFR calc Af Amer: >90 mL/min (ref >90)
Potassium: 2.9 mEq/L — ABNORMAL LOW (ref 3.5–5.1)
Sodium: 138 mEq/L (ref 135–145)
Total Protein: 6.6 g/dL (ref 6.0–8.3)

## 2011-08-27 LAB — CBC
Hemoglobin: 10.6 g/dL — ABNORMAL LOW (ref 13.0–17.0)
MCH: 31.4 pg (ref 26.0–34.0)
MCHC: 34.2 g/dL (ref 30.0–36.0)
Platelets: 251 10*3/uL (ref 150–400)
RDW: 12.9 % (ref 11.5–15.5)

## 2011-08-27 LAB — URINALYSIS, ROUTINE W REFLEX MICROSCOPIC
Bilirubin Urine: NEGATIVE
Glucose, UA: NEGATIVE mg/dL
Ketones, ur: NEGATIVE mg/dL
Nitrite: NEGATIVE
Specific Gravity, Urine: 1.044 — ABNORMAL HIGH (ref 1.005–1.030)
pH: 6.5 (ref 5.0–8.0)

## 2011-08-27 LAB — GLUCOSE, CAPILLARY: Glucose-Capillary: 112 mg/dL — ABNORMAL HIGH (ref 70–99)

## 2011-08-27 MED ORDER — CARVEDILOL 6.25 MG PO TABS
6.2500 mg | ORAL_TABLET | Freq: Two times a day (BID) | ORAL | Status: DC
  Administered 2011-08-27 – 2011-08-30 (×6): 6.25 mg via ORAL
  Filled 2011-08-27 (×9): qty 1

## 2011-08-27 MED ORDER — VITAMIN D3 25 MCG (1000 UNIT) PO TABS
1000.0000 [IU] | ORAL_TABLET | Freq: Every day | ORAL | Status: DC
  Administered 2011-08-28 – 2011-08-30 (×3): 1000 [IU] via ORAL
  Filled 2011-08-27 (×4): qty 1

## 2011-08-27 MED ORDER — SIMVASTATIN 40 MG PO TABS
40.0000 mg | ORAL_TABLET | Freq: Every day | ORAL | Status: DC
  Administered 2011-08-27: 40 mg via ORAL
  Filled 2011-08-27 (×2): qty 1

## 2011-08-27 MED ORDER — VITAMIN C 500 MG PO TABS
500.0000 mg | ORAL_TABLET | Freq: Every day | ORAL | Status: DC
  Administered 2011-08-28 – 2011-08-30 (×3): 500 mg via ORAL
  Filled 2011-08-27 (×4): qty 1

## 2011-08-27 MED ORDER — INSULIN ASPART 100 UNIT/ML ~~LOC~~ SOLN: 0.0000 [IU] | Freq: Every day | SUBCUTANEOUS | Status: DC

## 2011-08-27 MED ORDER — INSULIN ASPART 100 UNIT/ML ~~LOC~~ SOLN
0.0000 [IU] | Freq: Three times a day (TID) | SUBCUTANEOUS | Status: DC
  Filled 2011-08-27: qty 3

## 2011-08-27 MED ORDER — PHENOL 1.4 % MT LIQD: 1.0000 | OROMUCOSAL | Status: DC | PRN

## 2011-08-27 MED ORDER — LORAZEPAM 2 MG/ML IJ SOLN
1.0000 mg | INTRAMUSCULAR | Status: DC | PRN
  Administered 2011-08-28: 1 mg via INTRAVENOUS
  Filled 2011-08-27: qty 1

## 2011-08-27 MED ORDER — POTASSIUM CHLORIDE CRYS ER 20 MEQ PO TBCR
20.0000 meq | EXTENDED_RELEASE_TABLET | Freq: Three times a day (TID) | ORAL | Status: DC
  Administered 2011-08-27 – 2011-08-30 (×8): 20 meq via ORAL
  Filled 2011-08-27 (×11): qty 1

## 2011-08-27 MED ORDER — SODIUM CHLORIDE 0.9 % IJ SOLN
3.0000 mL | Freq: Two times a day (BID) | INTRAMUSCULAR | Status: DC
  Administered 2011-08-27 – 2011-08-29 (×4): 3 mL via INTRAVENOUS

## 2011-08-27 MED ORDER — ACETAMINOPHEN 650 MG RE SUPP: 650.0000 mg | RECTAL | Status: DC | PRN

## 2011-08-27 MED ORDER — IOHEXOL 300 MG/ML  SOLN
80.0000 mL | Freq: Once | INTRAMUSCULAR | Status: AC | PRN
  Administered 2011-08-27: 80 mL via INTRAVENOUS

## 2011-08-27 MED ORDER — ACETAMINOPHEN 325 MG PO TABS: 650.0000 mg | ORAL_TABLET | ORAL | Status: DC | PRN

## 2011-08-27 MED ORDER — SERTRALINE HCL 50 MG PO TABS
50.0000 mg | ORAL_TABLET | Freq: Every day | ORAL | Status: DC
  Administered 2011-08-28 – 2011-08-30 (×3): 50 mg via ORAL
  Filled 2011-08-27 (×4): qty 1

## 2011-08-27 MED ORDER — THERA M PLUS PO TABS
1.0000 | ORAL_TABLET | Freq: Every day | ORAL | Status: DC
  Administered 2011-08-28 – 2011-08-30 (×3): 1 via ORAL
  Filled 2011-08-27 (×4): qty 1

## 2011-08-27 MED ORDER — ONDANSETRON HCL 4 MG/2ML IJ SOLN: 4.0000 mg | INTRAMUSCULAR | Status: DC | PRN

## 2011-08-27 MED ORDER — HYDROCODONE-ACETAMINOPHEN 5-325 MG PO TABS: 1.0000 | ORAL_TABLET | ORAL | Status: DC | PRN

## 2011-08-27 MED ORDER — MENTHOL 3 MG MT LOZG
1.0000 | LOZENGE | OROMUCOSAL | Status: DC | PRN
  Filled 2011-08-27: qty 9

## 2011-08-27 MED ORDER — POTASSIUM CHLORIDE 20 MEQ PO PACK: 20.0000 meq | PACK | Freq: Three times a day (TID) | ORAL | Status: DC

## 2011-08-27 MED ORDER — HYDROCHLOROTHIAZIDE 50 MG PO TABS
50.0000 mg | ORAL_TABLET | Freq: Every day | ORAL | Status: DC
  Filled 2011-08-27 (×2): qty 1

## 2011-08-27 MED ORDER — DOCUSATE SODIUM 100 MG PO CAPS
100.0000 mg | ORAL_CAPSULE | Freq: Two times a day (BID) | ORAL | Status: DC
  Administered 2011-08-27 – 2011-08-29 (×4): 100 mg via ORAL
  Filled 2011-08-27 (×4): qty 1

## 2011-08-27 MED ORDER — LABETALOL HCL 5 MG/ML IV SOLN
10.0000 mg | Freq: Once | INTRAVENOUS | Status: AC
  Administered 2011-08-27: 10 mg via INTRAVENOUS
  Filled 2011-08-27: qty 4

## 2011-08-27 MED ORDER — POTASSIUM CHLORIDE CRYS ER 20 MEQ PO TBCR
20.0000 meq | EXTENDED_RELEASE_TABLET | Freq: Three times a day (TID) | ORAL | Status: DC
  Filled 2011-08-27 (×2): qty 1
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
## 2011-08-27 NOTE — Progress Notes (Signed)
Subjective: Patient reports no pain. confusion Objective: Vital signs in last 24 hours: Temp:  [98.4 F (36.9 C)] 98.4 F (36.9 C) (11/12 0631) Pulse Rate:  [76-99] 76  (11/12 0631) Resp:  [18] 18  (11/12 0631) BP: (154-206)/(69-79) 167/69 mmHg (11/12 0631) SpO2:  [90 %-97 %] 97 % (11/12 0631)  Intake/Output from previous day: 11/11 0701 - 11/12 0700 In: -  Out: 200 [Urine:200] Intake/Output this shift:      Lab Results:  [facility] 08/27/11 0620  WBC 13.6*  HGB 10.6*  HCT 31.0*  PLT 251   BMET  Basename 08/27/11 0620  NA 138  K 2.9*  CL 99  CO2 30  GLUCOSE 123*  BUN 24*  CREATININE 0.82  CALCIUM 9.2    Studies/Results: No results found.  Assessment/Plan: discharge yesterday by dr Phoebe Perch. Readmitted because confusion.wound witd small drainage. Weakness od right deltoids. Spoke with daughter. Plan is to observe him. Will get a ct head  LOS: 0 days     Desiree Fleming M 08/27/2011, 9:43 AM

## 2011-08-27 NOTE — Progress Notes (Signed)
PT Cancellation Note  Treatment cancelled today due to medical issues with patient which prohibited therapy--pt with increased confusion/hallucinations and agitation with security called to assist with returning pt to room.  On later attempt to see pt, pt gone for CT.   Will assess need for PT evaluation 08/28/11.   08/27/2011 Veda Canning, PT Pager: [phone]

## 2011-08-27 NOTE — Progress Notes (Signed)
No further acute OT needs, will sign off with recommendation for pt to have 24/7 Supervision and HHOT. Did communicate with his nurse that the patient's daughter reports he had a CT scan at [facility] yesterday and so the nurse is contacting Dr. Jeral Fruit to let him know.  In addition, pt is having difficulty swallowing even liquids--says his through hurts. Daughter reports that he did not eat anything yesterday and only two bites of breakfast this morning. Also witnessed thin liquids (Ensure and water causing him to cough).  Ignacia Palma, OTR/L [phone]

## 2011-08-27 NOTE — H&P (Signed)
  Subjective: Patient reports acute confusion/ hallucinations. S/P neck surgery 2 days ago by Jeral Fruit. D/Ced earlier today, now re-admitted after being seen at [facility] ER.  Objective: Vital signs in last 24 hours: Temp:  [97.4 F (36.3 C)-98.4 F (36.9 C)] 98.4 F (36.9 C) (11/12 0135) Pulse Rate:  [61-97] 97  (11/12 0135) Resp:  [18] 18  (11/12 0135) BP: (136-206)/(53-74) 206/74 mmHg (11/12 0135) SpO2:  [90 %-98 %] 90 % (11/12 0135)  Intake/Output from previous day: 11/11 0701 - 11/12 0700 In: -  Out: 200 [Urine:200] Intake/Output this shift: Total I/O In: -  Out: 200 [Urine:200]  Lab Results: Lab Results  Component Value Date   WBC 9.8 08/22/2011   HGB 13.1 08/22/2011   HCT 37.9* 08/22/2011   MCV 91.5 08/22/2011   PLT 280 08/22/2011   No results found for this basename: INR, PROTIME   BMET Lab Results  Component Value Date   NA 136 08/22/2011   K 4.0 08/22/2011   CL 97 08/22/2011   CO2 30 08/22/2011   GLUCOSE 98 08/22/2011   BUN 27* 08/22/2011   CREATININE 0.98 08/22/2011   CALCIUM 10.0 08/22/2011    Studies/Results: No results found.  Assessment/Plan: Acute delirium, cause unknown  LOS: 0 days    Lochlan Grygiel S 08/27/2011, 1:50 AM
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
## 2011-08-27 NOTE — Plan of Care (Signed)
Problem: Phase I Progression Outcomes Goal: OOB as tolerated unless otherwise ordered Outcome: Completed/Met Date Met:  08/27/11 Pt up and moving by himself in the room and ambulated in the halls with OT.

## 2011-08-28 ENCOUNTER — Inpatient Hospital Stay (HOSPITAL_COMMUNITY): Payer: Medicare Other

## 2011-08-28 DIAGNOSIS — R4182 Altered mental status, unspecified: Secondary | ICD-10-CM

## 2011-08-28 LAB — DIFFERENTIAL
Basophils Absolute: 0 10*3/uL (ref 0.0–0.1)
Lymphocytes Relative: 14 % (ref 12–46)
Lymphs Abs: 1.8 10*3/uL (ref 0.7–4.0)
Monocytes Absolute: 1.6 10*3/uL — ABNORMAL HIGH (ref 0.1–1.0)
Neutro Abs: 9.7 10*3/uL — ABNORMAL HIGH (ref 1.7–7.7)

## 2011-08-28 LAB — CBC
HCT: 33.5 % — ABNORMAL LOW (ref 39.0–52.0)
Platelets: 303 10*3/uL (ref 150–400)
RBC: 3.64 MIL/uL — ABNORMAL LOW (ref 4.22–5.81)
RDW: 13 % (ref 11.5–15.5)
WBC: 13.1 10*3/uL — ABNORMAL HIGH (ref 4.0–10.5)

## 2011-08-28 LAB — BASIC METABOLIC PANEL
CO2: 28 mEq/L (ref 19–32)
Chloride: 101 mEq/L (ref 96–112)
Sodium: 139 mEq/L (ref 135–145)

## 2011-08-28 LAB — GLUCOSE, CAPILLARY
Glucose-Capillary: 115 mg/dL — ABNORMAL HIGH (ref 70–99)
Glucose-Capillary: 171 mg/dL — ABNORMAL HIGH (ref 70–99)

## 2011-08-28 LAB — HEMOGLOBIN A1C: Hgb A1c MFr Bld: 6.1 % — ABNORMAL HIGH (ref <5.7)

## 2011-08-28 LAB — BLOOD GAS, ARTERIAL
Drawn by: 31996
FIO2: 0.21 %
Patient temperature: 98.6
pH, Arterial: 7.371 (ref 7.350–7.450)

## 2011-08-28 LAB — URINE CULTURE
Colony Count: 30000
Culture  Setup Time: 201211130157

## 2011-08-28 MED ORDER — FLUMAZENIL 0.5 MG/5ML IV SOLN
INTRAVENOUS | Status: AC
  Administered 2011-08-28: 0.5 mg via INTRAVENOUS
  Filled 2011-08-28: qty 5

## 2011-08-28 MED ORDER — FLUMAZENIL 0.5 MG/5ML IV SOLN
0.5000 mg | INTRAVENOUS | Status: DC | PRN
  Administered 2011-08-28: 0.5 mg via INTRAVENOUS

## 2011-08-28 MED ORDER — KCL-LACTATED RINGERS 20 MEQ/L IV SOLN
INTRAVENOUS | Status: DC
  Administered 2011-08-28: 11:00:00 via INTRAVENOUS
  Filled 2011-08-28: qty 1000

## 2011-08-28 MED ORDER — FLUMAZENIL 0.5 MG/5ML IV SOLN
0.5000 mg | Freq: Once | INTRAVENOUS | Status: AC
  Administered 2011-08-28: 0.5 mg via INTRAVENOUS

## 2011-08-28 MED ORDER — SODIUM CHLORIDE 0.9 % IV SOLN
INTRAVENOUS | Status: DC
  Administered 2011-08-28 – 2011-08-29 (×2): via INTRAVENOUS
  Filled 2011-08-28 (×3): qty 1000

## 2011-08-28 MED ORDER — HYDROCHLOROTHIAZIDE 25 MG PO TABS
25.0000 mg | ORAL_TABLET | Freq: Every day | ORAL | Status: DC
Start: 2011-08-28 — End: 2011-08-30
  Administered 2011-08-30: 25 mg via ORAL
  Filled 2011-08-28 (×3): qty 1

## 2011-08-28 MED ORDER — AMLODIPINE BESYLATE 5 MG PO TABS
5.0000 mg | ORAL_TABLET | Freq: Every day | ORAL | Status: DC
  Administered 2011-08-28 – 2011-08-29 (×2): 5 mg via ORAL
  Filled 2011-08-28 (×3): qty 1

## 2011-08-28 MED ORDER — ROSUVASTATIN CALCIUM 10 MG PO TABS
10.0000 mg | ORAL_TABLET | Freq: Every day | ORAL | Status: DC
  Administered 2011-08-28 – 2011-08-29 (×2): 10 mg via ORAL
  Filled 2011-08-28 (×3): qty 1

## 2011-08-28 NOTE — Significant Event (Signed)
Patient received 1mg  IV Ativan for extreme agitation, fighting, biting, kicking & within 20 min became lethargic, response to deep stim only with decreasing O2 sat / ability to manage saliva.  Dr Jordan Likes notified and new orders received for Flumazenil 0.5 mg IV, repeated x 1 due to continued inability to manage saliva.  Periods of apnea resembling sleep apnea once second dose given.  Continuous pulse ox 93-100% on 4 L humidified O2.  Dr Jordan Likes notified of continued issues / progress.  Continuous monitoring and observation by staff & Rapid  Respnose RN.  Stabalized, VSS.

## 2011-08-28 NOTE — Progress Notes (Signed)
Subjective: Patient reports  Stable.more awake.oriented x 2. eating Objective: Vital signs in last 24 hours: Temp:  [97.5 F (36.4 C)-97.9 F (36.6 C)] 97.8 F (36.6 C) (11/13 0500) Pulse Rate:  [59-80] 80  (11/13 0925) Resp:  [17-20] 17  (11/13 0500) BP: (158-171)/(73-83) 158/73 mmHg (11/13 0500) SpO2:  [92 %-100 %] 99 % (11/13 0925)  Intake/Output from previous day: 11/12 0701 - 11/13 0700 In: 240 [P.O.:240] Out: 500 [Urine:500] Intake/Output this shift:    Stabvle.moves all 4 limbs. Still deltoids 0-1/5 f/c well  Wounds dry  Lab Results:  [facility] 08/28/11 0640 08/27/11 0620  WBC 13.1* 13.6*  HGB 11.4* 10.6*  HCT 33.5* 31.0*  PLT 303 251   BMET  Basename 08/28/11 0640 08/27/11 0620  NA 139 138  K 3.6 2.9*  CL 101 99  CO2 28 30  GLUCOSE 120* 123*  BUN 25* 24*  CREATININE 0.72 0.82  CALCIUM 9.4 9.2    Studies/Results: Ct Head W Wo Contrast  08/27/2011  *RADIOLOGY REPORT*  Clinical Data: Recent discharge for cervical spine surgery, now with hallucinations and confusion.  CT HEAD WITHOUT AND WITH CONTRAST  Technique:  Contiguous axial images were obtained from the base of the skull through the vertex without and with intravenous contrast.  Contrast: 80mL OMNIPAQUE IOHEXOL 300 MG/ML IV SOLN  Comparison: None.  Findings: There is no evidence for acute stroke, intracranial hemorrhage, mass lesion, or hydrocephalus.  Bilateral CSF like extra-axial fluid collections can be seen over the frontal lobes as well as anterior to the temporal lobes, consistent with subdural hygromas.  These are age indeterminate.  There is no skull fracture.  Post infusion, there is no abnormal enhancement of the brain or visible meninges.  Calvarium intact.  Sinuses clear.  Right mastoid fluid is indeterminate finding.  Mild vascular calcification.  Previous cataract extraction.  IMPRESSION: Bilateral CSF like extra-axial fluid collections over the frontal and temporal regions consistent with  subdural hygromas.  These are age indeterminate.  There is no visible acute infarct, acute intracranial hemorrhage, skull fracture, or abnormal intracranial enhancement.  Original Report Authenticated By: Elsie Stain, Marsh.D.   Dg Chest Port 1 View  08/28/2011  *RADIOLOGY REPORT*  Clinical Data: Congestion and productive cough.  PORTABLE CHEST - 1 VIEW  Comparison: Chest radiograph performed 08/24/2011  Findings: The lungs are well-aerated and clear.  There is no evidence of focal opacification, pleural effusion or pneumothorax. Density near the right lung apex appears reflect overlying osseous structures.  The cardiomediastinal silhouette is normal in size; calcification is noted within the aortic arch.  No acute osseous abnormalities are seen.  Cervical spinal fusion hardware is partially imaged.  IMPRESSION: No acute cardiopulmonary process seen.  No evidence for aspiration.  Original Report Authenticated By: Tonia Ghent, Marsh.D.    Assessment/Plan: Continue ivf. No need for sitters  LOS: 1 day     Anthony Marsh 08/28/2011, 1:19 PM

## 2011-08-28 NOTE — Progress Notes (Signed)
Subjective: Patient reports   Objective: Vital signs in last 24 hours: Temp:  [97.5 F (36.4 C)-97.9 F (36.6 C)] 97.8 F (36.6 C) (11/13 0500) Pulse Rate:  [59-80] 80  (11/13 0925) Resp:  [17-20] 17  (11/13 0500) BP: (158-171)/(73-83) 158/73 mmHg (11/13 0500) SpO2:  [92 %-100 %] 99 % (11/13 0925)  Intake/Output from previous day: 11/12 0701 - 11/13 0700 In: 240 [P.O.:240] Out: 500 [Urine:500] Intake/Output this shift:    see below . Refusing to eat or drink. Confused. sedated Lab Results:  [facility] 08/28/11 0640 08/27/11 0620  WBC 13.1* 13.6*  HGB 11.4* 10.6*  HCT 33.5* 31.0*  PLT 303 251   BMET  Basename 08/28/11 0640 08/27/11 0620  NA 139 138  K 3.6 2.9*  CL 101 99  CO2 28 30  GLUCOSE 120* 123*  BUN 25* 24*  CREATININE 0.72 0.82  CALCIUM 9.4 9.2    Studies/Results: Ct Head W Wo Contrast  08/27/2011  *RADIOLOGY REPORT*  Clinical Data: Recent discharge for cervical spine surgery, now with hallucinations and confusion.  CT HEAD WITHOUT AND WITH CONTRAST  Technique:  Contiguous axial images were obtained from the base of the skull through the vertex without and with intravenous contrast.  Contrast: 80mL OMNIPAQUE IOHEXOL 300 MG/ML IV SOLN  Comparison: None.  Findings: There is no evidence for acute stroke, intracranial hemorrhage, mass lesion, or hydrocephalus.  Bilateral CSF like extra-axial fluid collections can be seen over the frontal lobes as well as anterior to the temporal lobes, consistent with subdural hygromas.  These are age indeterminate.  There is no skull fracture.  Post infusion, there is no abnormal enhancement of the brain or visible meninges.  Calvarium intact.  Sinuses clear.  Right mastoid fluid is indeterminate finding.  Mild vascular calcification.  Previous cataract extraction.  IMPRESSION: Bilateral CSF like extra-axial fluid collections over the frontal and temporal regions consistent with subdural hygromas.  These are age indeterminate.  There  is no visible acute infarct, acute intracranial hemorrhage, skull fracture, or abnormal intracranial enhancement.  Original Report Authenticated By: Elsie Stain, M.D.   Dg Chest Port 1 View  08/28/2011  *RADIOLOGY REPORT*  Clinical Data: Congestion and productive cough.  PORTABLE CHEST - 1 VIEW  Comparison: Chest radiograph performed 08/24/2011  Findings: The lungs are well-aerated and clear.  There is no evidence of focal opacification, pleural effusion or pneumothorax. Density near the right lung apex appears reflect overlying osseous structures.  The cardiomediastinal silhouette is normal in size; calcification is noted within the aortic arch.  No acute osseous abnormalities are seen.  Cervical spinal fusion hardware is partially imaged.  IMPRESSION: No acute cardiopulmonary process seen.  No evidence for aspiration.  Original Report Authenticated By: Tonia Ghent, M.D.    Assessment/Plan:  LOS: 1 day  sedated.seen by hospilalist. Afebril.slight increase in wbc. Wounds dry , no drainage. Sitter perent. lp suggested by consultant. No obvious reason for his confusion. No narcotis at present only sedation last night. To start iv fluids  Romilda Proby M 08/28/2011, 10:02 AM
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
## 2011-08-28 NOTE — Progress Notes (Signed)
PT Cancellation Note  Treatment cancelled today due to medical issues with patient which prohibited therapy.  Per RN, pt remains very confused and combative.  Currently unsafe to attempt to mobilize pt to assess balance.  Will reassess on 08/29/11.   08/28/2011 Veda Canning, PT Pager: [phone]

## 2011-08-28 NOTE — Significant Event (Signed)
Rapid Response Event Note  Overview: Time Called: 0230 Arrival Time: 0231 Event Type: Respiratory  Initial Focused Assessment: pt on 2Lnc with O2 sat 93%. Currently side laying with decreased respirations and no gag reflex.  RT had placed a nasal trumpet to maintain airway.   Interventions: md notified by primary RN. Orders received for ramazicon, pcxr, and NTS.  VSS throughout event.   Event Summary: pt received two doses of 0.5mg  of ramazicon.  Currently RR in 20's. Pt becomes very agitated if stimulated and does have a productive cough now.  Pt unwilling to be oral suctioned (he will bite on the yonker). Bedside RN to cont to monitor pt and update MD as needed.   at      at          [facility], [place]ie [place]acker

## 2011-08-28 NOTE — Progress Notes (Signed)
Subjective: Overnight events noted on this patient. Can't see the patient this morning the patient is laying in the bed head less than 30. The patient is sonorous and difficult to arouse. The patient appears to be having difficulty managing his secretions indicated by drilling down the side of his mouth. The patient also has an occasional cough productive of some secretions. The patient yesterday indicated that he would prefer to speak to a gentleman. I asked my colleague Dr. Alferd Patee who is fluent in Spanish to speak with the patient. Once the patient was calm down he was able to answer questions appropriately and having appropriate conversation in his native tell with Dr. Alferd Patee. Other laboratory studies overnight were reviewed and the patient does not appear to have a urinary tract infection as a source of his delirium. He does appear to have some possibly mild degree of dehydration. Objective: Filed Vitals:   08/27/11 1400 08/27/11 2100 08/28/11 0220 08/28/11 0500  BP: 162/80 169/83 171/75 158/73  Pulse: 68 59 75 65  Temp: 97.5 F (36.4 C) 97.9 F (36.6 C)  97.8 F (36.6 C)  TempSrc: Oral Oral  Oral  Resp: 20 17 18 17   SpO2: 96% 92% 95% 100%   Weight change:   Intake/Output Summary (Last 24 hours) at 08/28/11 4782 Last data filed at 08/27/11 1800  Gross per 24 hour  Intake    240 ml  Output    500 ml  Net   -260 ml    General:  oriented to person and place but unclear whether he is oriented to time , in no acute distress.  HEENT: [place]/AT pupils are sluggish but are equally reactive to light., EOMI, no icterus, no conjunctiva pallor or injection. Neck: Trachea midline,  no masses, no thyromegal,y no JVD, no carotid bruit OROPHARYNX:  Moist, No exudate/ erythema/lesions.  Heart: Regular rate and rhythm, without murmurs, rubs, gallops, PMI non-displaced, no heaves or thrills on palpation.  Lungs: Patient has transmitted upper airway sounds however there is no wheezing or rhonchi noted.  No increased vocal fremitus resonant to percussion  Abdomen: Soft, nontender, nondistended, positive bowel sounds, no masses no hepatosplenomegaly noted..  Neuro: No focal neurological deficits noted cranial nerves II through XII grossly intact. DTRs 2+ bilaterally upper and lower extremities. Strength 5 out of 5 in bilateral upper and lower extremities. Musculoskeletal: No warm swelling or erythema around joints, no spinal tenderness noted. Psychiatric: Patient exhibits a delirious affect but is answering questions appropriately.. Lymph node survey: No cervical axillary or inguinal lymphadenopathy noted.     Lab Results:  [facility] 08/28/11 0640 08/27/11 0620  NA 139 138  K 3.6 2.9*  CL 101 99  CO2 28 30  GLUCOSE 120* 123*  BUN 25* 24*  CREATININE 0.72 0.82  CALCIUM 9.4 9.2  MG -- --  PHOS -- --    Basename 08/27/11 0620  AST 34  ALT 15  ALKPHOS 60  BILITOT 0.5  PROT 6.6  ALBUMIN 3.3*   No results found for this basename: LIPASE:2,AMYLASE:2 in the last 72 hours  Basename 08/28/11 0640 08/27/11 0620  WBC 13.1* 13.6*  NEUTROABS 9.7* --  HGB 11.4* 10.6*  HCT 33.5* 31.0*  MCV 92.0 91.7  PLT 303 251   No results found for this basename: CKTOTAL:3,CKMB:3,CKMBINDEX:3,TROPONINI:3 in the last 72 hours No results found for this basename: POCBNP:3 in the last 72 hours No results found for this basename: DDIMER:2 in the last 72 hours  Basename 08/27/11 1924  HGBA1C 6.1*  No results found for this basename: CHOL:2,HDL:2,LDLCALC:2,TRIG:2,CHOLHDL:2,LDLDIRECT:2 in the last 72 hours No results found for this basename: TSH,T4TOTAL,FREET3,T3FREE,THYROIDAB in the last 72 hours No results found for this basename: VITAMINB12:2,FOLATE:2,FERRITIN:2,TIBC:2,IRON:2,RETICCTPCT:2 in the last 72 hours  Micro Results: Recent Results (from the past 240 hour(s))  SURGICAL PCR SCREEN     Status: Normal   Collection Time   08/22/11 12:14 PM      Component Value Range Status Comment   MRSA,  PCR NEGATIVE  NEGATIVE  Final    Staphylococcus aureus NEGATIVE  NEGATIVE  Final     Studies/Results: Dg Chest 2 View  08/24/2011  *RADIOLOGY REPORT*  Clinical Data: High blood pressure.  Preoperative exam.  CHEST - 2 VIEW  Comparison: None.  Findings: Diffuse increased lung markings may be chronic in origin. Difficult to confirm without comparison exams.  Stability can be confirmed on follow-up.  No infiltrate, congestive heart failure or pneumothorax.  Central pulmonary vascular prominence.  Heart size top normal.  Calcified mildly tortuous aorta.  Thoracic spine degenerative changes.  IMPRESSION: Increased lung markings suggestive of chronic changes as detailed above.  Original Report Authenticated By: Fuller Canada, M.D.   Dg Cervical Spine 2-3 Views Room 31 Neuro  08/24/2011  *RADIOLOGY REPORT*  Clinical Data: Cervical stenosis  CERVICAL SPINE - 2-3 VIEW  Comparison: MR 08/11/2011  Findings: The first lateral intraoperative cervical radiograph shows a needle from anterior approach PICC tip projecting in the C4- 5 interspace.  Second film shows changes of instrumented anterior ACDF C4-C6. C7 and the cervicothoracic junction not well seen.  IMPRESSION:  1.  ACDF from C4 to at least as far as C6.  Original Report Authenticated By: Osa Craver, M.D.   Ct Head W Wo Contrast  08/27/2011  *RADIOLOGY REPORT*  Clinical Data: Recent discharge for cervical spine surgery, now with hallucinations and confusion.  CT HEAD WITHOUT AND WITH CONTRAST  Technique:  Contiguous axial images were obtained from the base of the skull through the vertex without and with intravenous contrast.  Contrast: 80mL OMNIPAQUE IOHEXOL 300 MG/ML IV SOLN  Comparison: None.  Findings: There is no evidence for acute stroke, intracranial hemorrhage, mass lesion, or hydrocephalus.  Bilateral CSF like extra-axial fluid collections can be seen over the frontal lobes as well as anterior to the temporal lobes, consistent with  subdural hygromas.  These are age indeterminate.  There is no skull fracture.  Post infusion, there is no abnormal enhancement of the brain or visible meninges.  Calvarium intact.  Sinuses clear.  Right mastoid fluid is indeterminate finding.  Mild vascular calcification.  Previous cataract extraction.  IMPRESSION: Bilateral CSF like extra-axial fluid collections over the frontal and temporal regions consistent with subdural hygromas.  These are age indeterminate.  There is no visible acute infarct, acute intracranial hemorrhage, skull fracture, or abnormal intracranial enhancement.  Original Report Authenticated By: Elsie Stain, M.D.   Mr Cervical Spine Wo Contrast  08/11/2011  *RADIOLOGY REPORT*  Clinical Data: 77 year old male with neck pain, bilateral upper extremity numbness.  Weakness and numbness in the fingers.  MRI CERVICAL SPINE WITHOUT CONTRAST  Technique:  Multiplanar and multiecho pulse sequences of the cervical spine, to include the craniocervical junction and cervicothoracic junction, were obtained according to standard protocol without intravenous contrast.  Comparison: None.  Findings: Negative cervicomedullary junction except for bulky ligamentous hypertrophy posterior to the odontoid.  No marrow edema or evidence of acute osseous abnormality. Anterolisthesis of C2 on C3 measuring 3 mm.  Trace  anterolisthesis of C7 on T1. Visualized paraspinal soft tissues are within normal limits.  No definite spinal cord signal abnormality.  C2-C3:  Mild spinal stenosis without mass effect on the spinal cord related to ligament flavum hypertrophy and broad-based disc bulge. Severe right facet hypertrophy.  Moderate to severe right C3 foraminal stenosis.  C3-C4:  Circumferential disc bulge without significant spinal stenosis.  Right greater than left facet hypertrophy.  Moderate bilateral C4 foraminal stenosis.  C4-C5:  Spinal stenosis with mild flattening of the spinal cord related to bulky circumferential  disc osteophyte complex. Uncovertebral hypertrophy resulting in severe right greater than left C5 foraminal stenosis.  C5-C6:  Spinal stenosis with flattening of the spinal cord (AP thecal sac 4-5 mm) related to right eccentric bulky circumferential disc osteophyte complex.  Severe right and moderate left C6 foraminal stenosis.  C6-C7:  Spinal stenosis with minimal mass effect on the spinal cord related to circumferential disc osteophyte complex and ligament flavum hypertrophy.  Moderate to severe bilateral C7 foraminal stenosis.  C7-T1:  Central disc protrusion with partial high signal.  Ligament flavum hypertrophy.  Borderline spinal stenosis.  Facet hypertrophy.  Mild to moderate bilateral C8 foraminal stenosis, worse on the left.  T1-T2:  Mild facet hypertrophy and disc bulge.  Mild bilateral T1 foraminal stenosis.  T2-T3:  Incidental right perineural cyst.  IMPRESSION: 1.  Widespread cervical multifactorial spinal stenosis.  Mass effect on the spinal cord most pronounced at the C5-C6 level.  No definite spinal cord signal abnormality. 2.  Anterolisthesis of C2 on C3 with severe right facet hypertrophy and subsequent foraminal stenosis (see #3). 3.  Multifactorial moderate or severe neural foraminal stenosis at the right C3, bilateral C4, bilateral C5, bilateral C6, bilateral C7 and left C8 nerve levels.  Original Report Authenticated By: Harley Hallmark, M.D.   Dg Chest Port 1 View  08/28/2011  *RADIOLOGY REPORT*  Clinical Data: Congestion and productive cough.  PORTABLE CHEST - 1 VIEW  Comparison: Chest radiograph performed 08/24/2011  Findings: The lungs are well-aerated and clear.  There is no evidence of focal opacification, pleural effusion or pneumothorax. Density near the right lung apex appears reflect overlying osseous structures.  The cardiomediastinal silhouette is normal in size; calcification is noted within the aortic arch.  No acute osseous abnormalities are seen.  Cervical spinal fusion  hardware is partially imaged.  IMPRESSION: No acute cardiopulmonary process seen.  No evidence for aspiration.  Original Report Authenticated By: Tonia Ghent, M.D.    Medications: I have reviewed the patient's current medications. Scheduled Meds:   . carvedilol  6.25 mg Oral BID WC  . cholecalciferol  1,000 Units Oral Daily  . docusate sodium  100 mg Oral BID  . flumazenil  0.5 mg Intravenous Once  . hydrochlorothiazide  50 mg Oral Daily  . insulin aspart  0-15 Units Subcutaneous TID WC  . insulin aspart  0-5 Units Subcutaneous QHS  . multivitamins ther. w/minerals  1 tablet Oral Daily  . potassium chloride  20 mEq Oral TID  . sertraline  50 mg Oral Daily  . simvastatin  40 mg Oral QHS  . sodium chloride  3 mL Intravenous Q12H  . vitamin C  500 mg Oral Daily  . DISCONTD: potassium chloride  20 mEq Oral TID  . DISCONTD: potassium chloride  20 mEq Oral TID   Continuous Infusions:  PRN Meds:.acetaminophen, acetaminophen, flumazenil, iohexol, LORazepam, menthol-cetylpyridinium, ondansetron (ZOFRAN) IV, phenol, DISCONTD: HYDROcodone-acetaminophen Assessment/Plan: Patient Active Hospital Problem List: Delirium, acute (08/27/2011)  Assessment: I'm concerned that this patient's delirium is coming from a yet undiagnosed infection. I have discussed this with Dr. Jeral Fruit. My recommendation is the patient should have an MRI and an LP to further assess his condition. This patient has had an hyperacute response to Ativan and thus this is an undesirable agent for him to use. He will likely have to have these procedures done on her sedation may be with Precedex. I will place procedure consults with critical care for them to assess the possibility of performing this procedure under sedation in a monitored setting.  .   Diabetes mellitus (08/27/2011)   Assessment: The patient's blood sugars are well controlled and we'll continue sliding scale insulin.   HTN (hypertension) (08/27/2011)   Assessment:  Patient's blood sugars are elevated and given his confusion I would focus on controlling his blood pressures a little bit more tightly while he is hospitalized here I am concerned however that the large dose of hydrochlorothiazide  is lending itself to the side effects rather than benefit at this time. Thus I will decrease hydrochlorothiazide and add Norvasc.     DVT prophylaxis with SCDs for now  LOS: 1 day

## 2011-08-29 ENCOUNTER — Encounter (HOSPITAL_COMMUNITY): Payer: Self-pay | Admitting: Neurosurgery

## 2011-08-29 LAB — COMPREHENSIVE METABOLIC PANEL
ALT: 23 U/L (ref 0–53)
CO2: 27 mEq/L (ref 19–32)
Calcium: 9.2 mg/dL (ref 8.4–10.5)
Creatinine, Ser: 0.7 mg/dL (ref 0.50–1.35)
GFR calc Af Amer: >90 mL/min (ref >90)
GFR calc non Af Amer: 83 mL/min — ABNORMAL LOW (ref >90)
Glucose, Bld: 105 mg/dL — ABNORMAL HIGH (ref 70–99)
Sodium: 140 mEq/L (ref 135–145)

## 2011-08-29 LAB — DIFFERENTIAL
Basophils Absolute: 0 10*3/uL (ref 0.0–0.1)
Basophils Relative: 0 % (ref 0–1)
Monocytes Relative: 11 % (ref 3–12)
Neutro Abs: 8.5 10*3/uL — ABNORMAL HIGH (ref 1.7–7.7)
Neutrophils Relative %: 71 % (ref 43–77)

## 2011-08-29 LAB — GLUCOSE, CAPILLARY
Glucose-Capillary: 105 mg/dL — ABNORMAL HIGH (ref 70–99)
Glucose-Capillary: 152 mg/dL — ABNORMAL HIGH (ref 70–99)
Glucose-Capillary: 118 mg/dL — ABNORMAL HIGH (ref 70–99)

## 2011-08-29 LAB — CBC
Hemoglobin: 11.3 g/dL — ABNORMAL LOW (ref 13.0–17.0)
MCHC: 34.1 g/dL (ref 30.0–36.0)
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
## 2011-08-29 NOTE — Progress Notes (Signed)
Delirium has resolved. No ongoing PCCM issues. Will sign off. Please call PRN  Billy Fischer, MD;  PCCM service; Mobile [phone]

## 2011-08-29 NOTE — Progress Notes (Signed)
Subjective: Patient fully awake and alert today. The patient states he remembers the events of the last 2 days but is conscious that he was having some confusion. He also reports that during his hospitalization for his laminectomy he did not sleep at all which may have contributed to his acute delirium. Objective: Filed Vitals:   08/29/11 0200 08/29/11 0640 08/29/11 0749 08/29/11 1054  BP: 140/76 148/77 164/74 137/66  Pulse: 76 77 65 64  Temp: 98.6 F (37 C) 98.6 F (37 C) 98.6 F (37 C) 97.9 F (36.6 C)  TempSrc: Oral Oral Oral Oral  Resp: 24 20 16 20   SpO2: 98% 97% 97% 96%   Weight change:   Intake/Output Summary (Last 24 hours) at 08/29/11 1406 Last data filed at 08/29/11 0600  Gross per 24 hour  Intake 2046.25 ml  Output    400 ml  Net 1646.25 ml    General: Alert, awake, oriented x3, in no acute distress.  HEENT: [place]/AT PEERL, EOMI Neck: Trachea midline,  no masses, no thyromegal,y no JVD, no carotid bruit OROPHARYNX:  Moist, No exudate/ erythema/lesions.  Heart: Regular rate and rhythm, without murmurs, rubs, gallops, PMI non-displaced, no heaves or thrills on palpation.  Lungs: Clear to auscultation, no wheezing or rhonchi noted. No increased vocal fremitus resonant to percussion  Abdomen: Soft, nontender, nondistended, positive bowel sounds, no masses no hepatosplenomegaly noted..  Neuro: No focal neurological deficits noted cranial nerves II through XII grossly intact. DTRs 2+ bilaterally upper and lower extremities. Strength 5 out of 5 in bilateral upper and lower extremities. Musculoskeletal: No warm swelling or erythema around joints, no spinal tenderness noted. Psychiatric: Patient alert and oriented x3, good insight and cognition, good recent to remote recall. Lymph node survey: No cervical axillary or inguinal lymphadenopathy noted.       Basename 08/29/11 0541 08/28/11 0640  NA 140 139  K 4.2 3.6  CL 103 101  CO2 27 28  GLUCOSE 105* 120*  BUN 25* 25*    CREATININE 0.70 0.72  CALCIUM 9.2 9.4  MG -- --  PHOS -- --    Basename 08/29/11 0541 08/27/11 0620  AST 32 34  ALT 23 15  ALKPHOS 61 60  BILITOT 0.6 0.5  PROT 6.5 6.6  ALBUMIN 3.0* 3.3*   No results found for this basename: LIPASE:2,AMYLASE:2 in the last 72 hours  Basename 08/29/11 0541 08/28/11 0640  WBC 12.1* 13.1*  NEUTROABS 8.5* 9.7*  HGB 11.3* 11.4*  HCT 33.1* 33.5*  MCV 92.2 92.0  PLT 313 303   No results found for this basename: CKTOTAL:3,CKMB:3,CKMBINDEX:3,TROPONINI:3 in the last 72 hours No results found for this basename: POCBNP:3 in the last 72 hours No results found for this basename: DDIMER:2 in the last 72 hours  Basename 08/27/11 1924  HGBA1C 6.1*   No results found for this basename: CHOL:2,HDL:2,LDLCALC:2,TRIG:2,CHOLHDL:2,LDLDIRECT:2 in the last 72 hours No results found for this basename: TSH,T4TOTAL,FREET3,T3FREE,THYROIDAB in the last 72 hours No results found for this basename: VITAMINB12:2,FOLATE:2,FERRITIN:2,TIBC:2,IRON:2,RETICCTPCT:2 in the last 72 hours  Micro Results: Recent Results (from the past 240 hour(s))  SURGICAL PCR SCREEN     Status: Normal   Collection Time   08/22/11 12:14 PM      Component Value Range Status Comment   MRSA, PCR NEGATIVE  NEGATIVE  Final    Staphylococcus aureus NEGATIVE  NEGATIVE  Final   URINE CULTURE     Status: Normal   Collection Time   08/27/11  6:49 PM  Component Value Range Status Comment   Specimen Description URINE, CLEAN CATCH   Final    Special Requests NONE   Final    Setup Time [id number]   Final    Colony Count 30,000 COLONIES/ML   Final    Culture     Final    Value: Multiple bacterial morphotypes present, none predominant. Suggest appropriate recollection if clinically indicated.   Report Status 08/28/2011 FINAL   Final     Studies/Results: Dg Chest 2 View  08/24/2011  *RADIOLOGY REPORT*  Clinical Data: High blood pressure.  Preoperative exam.  CHEST - 2 VIEW  Comparison: None.   Findings: Diffuse increased lung markings may be chronic in origin. Difficult to confirm without comparison exams.  Stability can be confirmed on follow-up.  No infiltrate, congestive heart failure or pneumothorax.  Central pulmonary vascular prominence.  Heart size top normal.  Calcified mildly tortuous aorta.  Thoracic spine degenerative changes.  IMPRESSION: Increased lung markings suggestive of chronic changes as detailed above.  Original Report Authenticated By: Fuller Canada, M.D.   Dg Cervical Spine 2-3 Views Room 31 Neuro  08/24/2011  *RADIOLOGY REPORT*  Clinical Data: Cervical stenosis  CERVICAL SPINE - 2-3 VIEW  Comparison: MR 08/11/2011  Findings: The first lateral intraoperative cervical radiograph shows a needle from anterior approach PICC tip projecting in the C4- 5 interspace.  Second film shows changes of instrumented anterior ACDF C4-C6. C7 and the cervicothoracic junction not well seen.  IMPRESSION:  1.  ACDF from C4 to at least as far as C6.  Original Report Authenticated By: Osa Craver, M.D.   Ct Head W Wo Contrast  08/27/2011  *RADIOLOGY REPORT*  Clinical Data: Recent discharge for cervical spine surgery, now with hallucinations and confusion.  CT HEAD WITHOUT AND WITH CONTRAST  Technique:  Contiguous axial images were obtained from the base of the skull through the vertex without and with intravenous contrast.  Contrast: 80mL OMNIPAQUE IOHEXOL 300 MG/ML IV SOLN  Comparison: None.  Findings: There is no evidence for acute stroke, intracranial hemorrhage, mass lesion, or hydrocephalus.  Bilateral CSF like extra-axial fluid collections can be seen over the frontal lobes as well as anterior to the temporal lobes, consistent with subdural hygromas.  These are age indeterminate.  There is no skull fracture.  Post infusion, there is no abnormal enhancement of the brain or visible meninges.  Calvarium intact.  Sinuses clear.  Right mastoid fluid is indeterminate finding.  Mild  vascular calcification.  Previous cataract extraction.  IMPRESSION: Bilateral CSF like extra-axial fluid collections over the frontal and temporal regions consistent with subdural hygromas.  These are age indeterminate.  There is no visible acute infarct, acute intracranial hemorrhage, skull fracture, or abnormal intracranial enhancement.  Original Report Authenticated By: Elsie Stain, M.D.   Mr Cervical Spine Wo Contrast  08/11/2011  *RADIOLOGY REPORT*  Clinical Data: 77 year old male with neck pain, bilateral upper extremity numbness.  Weakness and numbness in the fingers.  MRI CERVICAL SPINE WITHOUT CONTRAST  Technique:  Multiplanar and multiecho pulse sequences of the cervical spine, to include the craniocervical junction and cervicothoracic junction, were obtained according to standard protocol without intravenous contrast.  Comparison: None.  Findings: Negative cervicomedullary junction except for bulky ligamentous hypertrophy posterior to the odontoid.  No marrow edema or evidence of acute osseous abnormality. Anterolisthesis of C2 on C3 measuring 3 mm.  Trace anterolisthesis of C7 on T1. Visualized paraspinal soft tissues are within normal limits.  No definite spinal  cord signal abnormality.  C2-C3:  Mild spinal stenosis without mass effect on the spinal cord related to ligament flavum hypertrophy and broad-based disc bulge. Severe right facet hypertrophy.  Moderate to severe right C3 foraminal stenosis.  C3-C4:  Circumferential disc bulge without significant spinal stenosis.  Right greater than left facet hypertrophy.  Moderate bilateral C4 foraminal stenosis.  C4-C5:  Spinal stenosis with mild flattening of the spinal cord related to bulky circumferential disc osteophyte complex. Uncovertebral hypertrophy resulting in severe right greater than left C5 foraminal stenosis.  C5-C6:  Spinal stenosis with flattening of the spinal cord (AP thecal sac 4-5 mm) related to right eccentric bulky  circumferential disc osteophyte complex.  Severe right and moderate left C6 foraminal stenosis.  C6-C7:  Spinal stenosis with minimal mass effect on the spinal cord related to circumferential disc osteophyte complex and ligament flavum hypertrophy.  Moderate to severe bilateral C7 foraminal stenosis.  C7-T1:  Central disc protrusion with partial high signal.  Ligament flavum hypertrophy.  Borderline spinal stenosis.  Facet hypertrophy.  Mild to moderate bilateral C8 foraminal stenosis, worse on the left.  T1-T2:  Mild facet hypertrophy and disc bulge.  Mild bilateral T1 foraminal stenosis.  T2-T3:  Incidental right perineural cyst.  IMPRESSION: 1.  Widespread cervical multifactorial spinal stenosis.  Mass effect on the spinal cord most pronounced at the C5-C6 level.  No definite spinal cord signal abnormality. 2.  Anterolisthesis of C2 on C3 with severe right facet hypertrophy and subsequent foraminal stenosis (see #3). 3.  Multifactorial moderate or severe neural foraminal stenosis at the right C3, bilateral C4, bilateral C5, bilateral C6, bilateral C7 and left C8 nerve levels.  Original Report Authenticated By: Harley Hallmark, M.D.   Dg Chest Port 1 View  08/28/2011  *RADIOLOGY REPORT*  Clinical Data: Congestion and productive cough.  PORTABLE CHEST - 1 VIEW  Comparison: Chest radiograph performed 08/24/2011  Findings: The lungs are well-aerated and clear.  There is no evidence of focal opacification, pleural effusion or pneumothorax. Density near the right lung apex appears reflect overlying osseous structures.  The cardiomediastinal silhouette is normal in size; calcification is noted within the aortic arch.  No acute osseous abnormalities are seen.  Cervical spinal fusion hardware is partially imaged.  IMPRESSION: No acute cardiopulmonary process seen.  No evidence for aspiration.  Original Report Authenticated By: Tonia Ghent, M.D.    Medications: I have reviewed the patient's current  medications. Scheduled Meds:   . amLODipine  5 mg Oral Daily  . carvedilol  6.25 mg Oral BID WC  . cholecalciferol  1,000 Units Oral Daily  . docusate sodium  100 mg Oral BID  . hydrochlorothiazide  25 mg Oral Daily  . insulin aspart  0-15 Units Subcutaneous TID WC  . insulin aspart  0-5 Units Subcutaneous QHS  . multivitamins ther. w/minerals  1 tablet Oral Daily  . potassium chloride  20 mEq Oral TID  . rosuvastatin  10 mg Oral q1800  . sertraline  50 mg Oral Daily  . sodium chloride  3 mL Intravenous Q12H  . vitamin C  500 mg Oral Daily   Continuous Infusions:   . sodium chloride 0.9 % 1,000 mL with potassium chloride 20 mEq infusion 75 mL/hr at 08/29/11 0600   PRN Meds:.acetaminophen, acetaminophen, flumazenil, menthol-cetylpyridinium, ondansetron (ZOFRAN) IV, phenol Assessment/Plan: Patient Active Hospital Problem List: Delirium, acute (08/27/2011)   Assessment: Resolved. Felt to be hospital psychoses and likely multifactorial owing to patient's age status post procedure in hospital  setting in the setting of lack of sleep.    Diabetes mellitus (08/27/2011)   Assessment: Blood sugars are well controlled. The patient states that he normally controls his blood sugars with diet.    HTN (hypertension) (08/27/2011)   Assessment: Blood pressures are adequately controlled on current regimen. He will need further titration of medications as an outpatient. I would avoid hydrochlorothiazide at 50 mg secondary to no proven benefit but increased side effects.   from the perspective of internal medicine this patient and likely be discharged on tomorrow if he continues to have a clear sensorium. We'll continue to follow this patient with you thank you very much   LOS: 2 days

## 2011-08-30 LAB — GLUCOSE, CAPILLARY: Glucose-Capillary: 103 mg/dL — ABNORMAL HIGH (ref 70–99)

## 2011-08-30 MED ORDER — POTASSIUM CHLORIDE CRYS ER 20 MEQ PO TBCR: 20.0000 meq | EXTENDED_RELEASE_TABLET | Freq: Three times a day (TID) | ORAL | Status: DC

## 2011-08-30 MED ORDER — LISINOPRIL 10 MG PO TABS
10.0000 mg | ORAL_TABLET | Freq: Every day | ORAL | Status: DC
  Administered 2011-08-30: 10 mg via ORAL
  Filled 2011-08-30: qty 1

## 2011-08-30 MED ORDER — HYDROCHLOROTHIAZIDE 25 MG PO TABS: 25.0000 mg | ORAL_TABLET | Freq: Every day | ORAL | Status: DC

## 2011-08-30 MED ORDER — LISINOPRIL 10 MG PO TABS: 10.0000 mg | ORAL_TABLET | Freq: Every day | ORAL | Status: DC

## 2011-08-30 NOTE — Discharge Summary (Signed)
Physician Discharge Summary  Patient ID: Jivan Symanski MRN: [id number] DOB/AGE: Nov 14, 1922 77 y.o.  Admit date: 08/27/2011 Discharge date: 08/30/2011  Admission Diagnoses:  Discharge Diagnoses:  Principal Problem:  *Delirium, acute Active Problems:  Diabetes mellitus  HTN (hypertension)   Discharged Condition: stable.oriented. No c/o  Hospital Course: observed CT-scan of the abdomen head non surgical lesion.off narcotics  Consults:hospitalist  Significant Diagnostic Studies: negative ct head for surgical lesion  Treatments: observation  Discharge Exam: Blood pressure 150/78, pulse 76, temperature 98.8 F (37.1 C), temperature source Oral, resp. rate 22, SpO2 94.00%.   Disposition: home at the care of her daughter   Current Discharge Medication List none    START taking these medications   Details  lisinopril (PRINIVIL,ZESTRIL) 10 MG tablet Take 1 tablet (10 mg total) by mouth daily. Qty: 30 tablet, Refills: 0    potassium chloride SA (K-DUR,KLOR-CON) 20 MEQ tablet Take 1 tablet (20 mEq total) by mouth 3 (three) times daily. Qty: 30 tablet, Refills: 0      CONTINUE these medications which have CHANGED   Details  hydrochlorothiazide (HYDRODIURIL) 25 MG tablet Take 1 tablet (25 mg total) by mouth daily. Qty: 30 tablet, Refills: 0      CONTINUE these medications which have NOT CHANGED   Details  carvedilol (COREG) 6.25 MG tablet Take 6.25 mg by mouth 2 (two) times daily with a meal. Pt takes this medication at 8 am and 8 pm     clopidogrel (PLAVIX) 75 MG tablet Take 75 mg by mouth daily.      Multiple Vitamins-Minerals (MULTIVITAMINS THER. W/MINERALS) TABS Take 1 tablet by mouth daily.      sertraline (ZOLOFT) 100 MG tablet Take 50 mg by mouth daily.      simvastatin (ZOCOR) 40 MG tablet Take 40 mg by mouth at bedtime.          Signed: Karn Cassis 08/30/2011, 9:36 AM

## 2011-08-30 NOTE — Progress Notes (Signed)
Utilization review completed. Patience Nuzzo, RN, BSN. 08/30/11  

## 2011-08-30 NOTE — Progress Notes (Signed)
Subjective: [name] is markedly improved he feels his back to his baseline. He states that he feels wonderful he misses his daughter and his daughter and wants to go home. From my perspective I feel that his delirium is completely resolved and was likely secondary to hospital psychosis he combined with pain and pain medications. The patient showed no signs of infection throughout his entire hospital course and the workup was negative for infection. Objective: Filed Vitals:   08/29/11 1830 08/29/11 2200 08/30/11 0200 08/30/11 0642  BP: 145/61 162/66 158/70 150/78  Pulse: 78 68 70 76  Temp: 98.4 F (36.9 C) 98.8 F (37.1 C) 98.6 F (37 C) 98.8 F (37.1 C)  TempSrc:  Oral Oral Oral  Resp: 16 18 20 22   SpO2: 96% 94% 96% 94%   Weight change:   Intake/Output Summary (Last 24 hours) at 08/30/11 0859 Last data filed at 08/29/11 1830  Gross per 24 hour  Intake    480 ml  Output      0 ml  Net    480 ml    General: Alert, awake, oriented x3, in no acute distress.  HEENT: [place]/AT PEERL, EOMI Neck: Trachea midline,  no masses, no thyromegal,y no JVD, no carotid bruit OROPHARYNX:  Moist, No exudate/ erythema/lesions.  Heart: Regular rate and rhythm, without murmurs, rubs, gallops, PMI non-displaced, no heaves or thrills on palpation.  Lungs: Clear to auscultation, no wheezing or rhonchi noted. No increased vocal fremitus resonant to percussion  Abdomen: Soft, nontender, nondistended, positive bowel sounds, no masses no hepatosplenomegaly noted..  Neuro: No focal neurological deficits noted cranial nerves II through XII grossly intact. DTRs 2+ bilaterally upper and lower extremities. Strength 3/5 in right upper extremity and 5/ 5 in bilateral  lower extremities and left upper extremity.. Musculoskeletal: No warm swelling or erythema around joints, no spinal tenderness noted. Psychiatric: Patient alert and oriented x3, good insight and cognition, good recent to remote recall. Lymph node  survey: No cervical axillary or inguinal lymphadenopathy noted.     Lab Results:  [facility] 08/29/11 0541 08/28/11 0640  NA 140 139  K 4.2 3.6  CL 103 101  CO2 27 28  GLUCOSE 105* 120*  BUN 25* 25*  CREATININE 0.70 0.72  CALCIUM 9.2 9.4  MG -- --  PHOS -- --    Basename 08/29/11 0541  AST 32  ALT 23  ALKPHOS 61  BILITOT 0.6  PROT 6.5  ALBUMIN 3.0*   No results found for this basename: LIPASE:2,AMYLASE:2 in the last 72 hours  Basename 08/29/11 0541 08/28/11 0640  WBC 12.1* 13.1*  NEUTROABS 8.5* 9.7*  HGB 11.3* 11.4*  HCT 33.1* 33.5*  MCV 92.2 92.0  PLT 313 303   No results found for this basename: CKTOTAL:3,CKMB:3,CKMBINDEX:3,TROPONINI:3 in the last 72 hours No results found for this basename: POCBNP:3 in the last 72 hours No results found for this basename: DDIMER:2 in the last 72 hours  Basename 08/27/11 1924  HGBA1C 6.1*   No results found for this basename: CHOL:2,HDL:2,LDLCALC:2,TRIG:2,CHOLHDL:2,LDLDIRECT:2 in the last 72 hours No results found for this basename: TSH,T4TOTAL,FREET3,T3FREE,THYROIDAB in the last 72 hours No results found for this basename: VITAMINB12:2,FOLATE:2,FERRITIN:2,TIBC:2,IRON:2,RETICCTPCT:2 in the last 72 hours  Micro Results: Recent Results (from the past 240 hour(s))  SURGICAL PCR SCREEN     Status: Normal   Collection Time   08/22/11 12:14 PM      Component Value Range Status Comment   MRSA, PCR NEGATIVE  NEGATIVE  Final    Staphylococcus  aureus NEGATIVE  NEGATIVE  Final   URINE CULTURE     Status: Normal   Collection Time   08/27/11  6:49 PM      Component Value Range Status Comment   Specimen Description URINE, CLEAN CATCH   Final    Special Requests NONE   Final    Setup Time [id number]   Final    Colony Count 30,000 COLONIES/ML   Final    Culture     Final    Value: Multiple bacterial morphotypes present, none predominant. Suggest appropriate recollection if clinically indicated.   Report Status 08/28/2011 FINAL    Final     Studies/Results: Dg Chest 2 View  08/24/2011  *RADIOLOGY REPORT*  Clinical Data: High blood pressure.  Preoperative exam.  CHEST - 2 VIEW  Comparison: None.  Findings: Diffuse increased lung markings may be chronic in origin. Difficult to confirm without comparison exams.  Stability can be confirmed on follow-up.  No infiltrate, congestive heart failure or pneumothorax.  Central pulmonary vascular prominence.  Heart size top normal.  Calcified mildly tortuous aorta.  Thoracic spine degenerative changes.  IMPRESSION: Increased lung markings suggestive of chronic changes as detailed above.  Original Report Authenticated By: Fuller Canada, M.D.   Dg Cervical Spine 2-3 Views Room 31 Neuro  08/24/2011  *RADIOLOGY REPORT*  Clinical Data: Cervical stenosis  CERVICAL SPINE - 2-3 VIEW  Comparison: MR 08/11/2011  Findings: The first lateral intraoperative cervical radiograph shows a needle from anterior approach PICC tip projecting in the C4- 5 interspace.  Second film shows changes of instrumented anterior ACDF C4-C6. C7 and the cervicothoracic junction not well seen.  IMPRESSION:  1.  ACDF from C4 to at least as far as C6.  Original Report Authenticated By: Osa Craver, M.D.   Ct Head W Wo Contrast  08/27/2011  *RADIOLOGY REPORT*  Clinical Data: Recent discharge for cervical spine surgery, now with hallucinations and confusion.  CT HEAD WITHOUT AND WITH CONTRAST  Technique:  Contiguous axial images were obtained from the base of the skull through the vertex without and with intravenous contrast.  Contrast: 80mL OMNIPAQUE IOHEXOL 300 MG/ML IV SOLN  Comparison: None.  Findings: There is no evidence for acute stroke, intracranial hemorrhage, mass lesion, or hydrocephalus.  Bilateral CSF like extra-axial fluid collections can be seen over the frontal lobes as well as anterior to the temporal lobes, consistent with subdural hygromas.  These are age indeterminate.  There is no skull fracture.   Post infusion, there is no abnormal enhancement of the brain or visible meninges.  Calvarium intact.  Sinuses clear.  Right mastoid fluid is indeterminate finding.  Mild vascular calcification.  Previous cataract extraction.  IMPRESSION: Bilateral CSF like extra-axial fluid collections over the frontal and temporal regions consistent with subdural hygromas.  These are age indeterminate.  There is no visible acute infarct, acute intracranial hemorrhage, skull fracture, or abnormal intracranial enhancement.  Original Report Authenticated By: Elsie Stain, M.D.   Mr Cervical Spine Wo Contrast  08/11/2011  *RADIOLOGY REPORT*  Clinical Data: 77 year old male with neck pain, bilateral upper extremity numbness.  Weakness and numbness in the fingers.  MRI CERVICAL SPINE WITHOUT CONTRAST  Technique:  Multiplanar and multiecho pulse sequences of the cervical spine, to include the craniocervical junction and cervicothoracic junction, were obtained according to standard protocol without intravenous contrast.  Comparison: None.  Findings: Negative cervicomedullary junction except for bulky ligamentous hypertrophy posterior to the odontoid.  No marrow edema or evidence  of acute osseous abnormality. Anterolisthesis of C2 on C3 measuring 3 mm.  Trace anterolisthesis of C7 on T1. Visualized paraspinal soft tissues are within normal limits.  No definite spinal cord signal abnormality.  C2-C3:  Mild spinal stenosis without mass effect on the spinal cord related to ligament flavum hypertrophy and broad-based disc bulge. Severe right facet hypertrophy.  Moderate to severe right C3 foraminal stenosis.  C3-C4:  Circumferential disc bulge without significant spinal stenosis.  Right greater than left facet hypertrophy.  Moderate bilateral C4 foraminal stenosis.  C4-C5:  Spinal stenosis with mild flattening of the spinal cord related to bulky circumferential disc osteophyte complex. Uncovertebral hypertrophy resulting in severe right  greater than left C5 foraminal stenosis.  C5-C6:  Spinal stenosis with flattening of the spinal cord (AP thecal sac 4-5 mm) related to right eccentric bulky circumferential disc osteophyte complex.  Severe right and moderate left C6 foraminal stenosis.  C6-C7:  Spinal stenosis with minimal mass effect on the spinal cord related to circumferential disc osteophyte complex and ligament flavum hypertrophy.  Moderate to severe bilateral C7 foraminal stenosis.  C7-T1:  Central disc protrusion with partial high signal.  Ligament flavum hypertrophy.  Borderline spinal stenosis.  Facet hypertrophy.  Mild to moderate bilateral C8 foraminal stenosis, worse on the left.  T1-T2:  Mild facet hypertrophy and disc bulge.  Mild bilateral T1 foraminal stenosis.  T2-T3:  Incidental right perineural cyst.  IMPRESSION: 1.  Widespread cervical multifactorial spinal stenosis.  Mass effect on the spinal cord most pronounced at the C5-C6 level.  No definite spinal cord signal abnormality. 2.  Anterolisthesis of C2 on C3 with severe right facet hypertrophy and subsequent foraminal stenosis (see #3). 3.  Multifactorial moderate or severe neural foraminal stenosis at the right C3, bilateral C4, bilateral C5, bilateral C6, bilateral C7 and left C8 nerve levels.  Original Report Authenticated By: Harley Hallmark, M.D.   Dg Chest Port 1 View  08/28/2011  *RADIOLOGY REPORT*  Clinical Data: Congestion and productive cough.  PORTABLE CHEST - 1 VIEW  Comparison: Chest radiograph performed 08/24/2011  Findings: The lungs are well-aerated and clear.  There is no evidence of focal opacification, pleural effusion or pneumothorax. Density near the right lung apex appears reflect overlying osseous structures.  The cardiomediastinal silhouette is normal in size; calcification is noted within the aortic arch.  No acute osseous abnormalities are seen.  Cervical spinal fusion hardware is partially imaged.  IMPRESSION: No acute cardiopulmonary process seen.   No evidence for aspiration.  Original Report Authenticated By: Tonia Ghent, M.D.    Medications: I have reviewed the patient's current medications. Scheduled Meds:   . carvedilol  6.25 mg Oral BID WC  . cholecalciferol  1,000 Units Oral Daily  . docusate sodium  100 mg Oral BID  . hydrochlorothiazide  25 mg Oral Daily  . insulin aspart  0-15 Units Subcutaneous TID WC  . insulin aspart  0-5 Units Subcutaneous QHS  . lisinopril  10 mg Oral Daily  . multivitamins ther. w/minerals  1 tablet Oral Daily  . potassium chloride  20 mEq Oral TID  . rosuvastatin  10 mg Oral q1800  . sertraline  50 mg Oral Daily  . sodium chloride  3 mL Intravenous Q12H  . vitamin C  500 mg Oral Daily  . DISCONTD: amLODipine  5 mg Oral Daily   Continuous Infusions:   . DISCONTD: sodium chloride 0.9 % 1,000 mL with potassium chloride 20 mEq infusion 75 mL/hr at 08/29/11 0600  PRN Meds:.acetaminophen, acetaminophen, flumazenil, menthol-cetylpyridinium, ondansetron (ZOFRAN) IV, phenol Assessment/Plan: Patient Active Hospital Problem List: Delirium, acute (08/27/2011)   Assessment: Delirium completely resolved patient back to baseline.   Diabetes mellitus (08/27/2011)   Assessment: Blood sugars have been less than 150 without intervention. The patient controls his diabetes by diet and should continue.  HTN (hypertension) (08/27/2011)   Assessment: Patient has exhibited stage I hypertension during his hospitalization. His medications have been modified to include an ACE inhibitor which is appropriate for someone who has known diabetes. Additionally I have decreased his hydrochlorothiazide from 50 mg down to 25 mg isn't effective units of renal sufficiency and dehydration on this admission. Patient is stable for discharge from internal medicine perspective. Thank you for inviting me to anticipate in the care of this patient we'll sign off thank you     LOS: 3 days

## 2011-10-08 ENCOUNTER — Other Ambulatory Visit (HOSPITAL_COMMUNITY): Payer: Self-pay | Admitting: Internal Medicine

## 2011-10-16 DIAGNOSIS — T8859XA Other complications of anesthesia, initial encounter: Secondary | ICD-10-CM

## 2011-10-16 HISTORY — DX: Other complications of anesthesia, initial encounter: T88.59XA

## 2012-01-06 IMAGING — CT CT HEAD WO/W CM
1 of 2 series · 13 of 30 positions shown, 17 images · IV contrast (omnipaque)
Comparison: None.

CLINICAL DATA: Recent discharge for cervical spine surgery, now
with hallucinations and confusion.

CT HEAD WITHOUT AND WITH CONTRAST
TECHNIQUE: Contiguous axial images were obtained from the base of
the skull through the vertex without and with intravenous contrast.
Contrast: 80mL OMNIPAQUE IOHEXOL 300 MG/ML IV SOLN

[Series 2: head routine 4.8 h37s · axial · 0.48mm/px · z∈[+1157,+1305]mm · 13 of 36 slices shown, 17 images]
[im 3/36  brain]
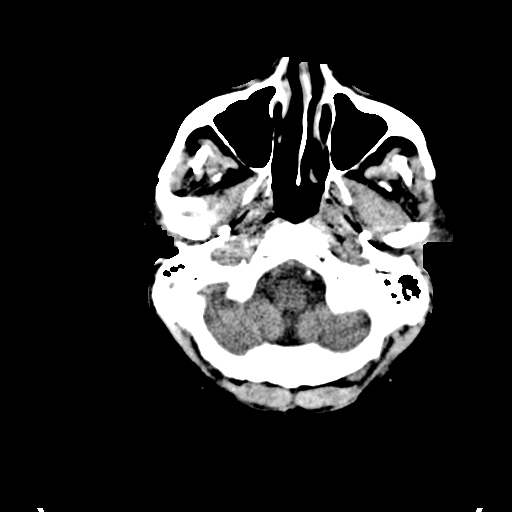
[im 3/36  bone]
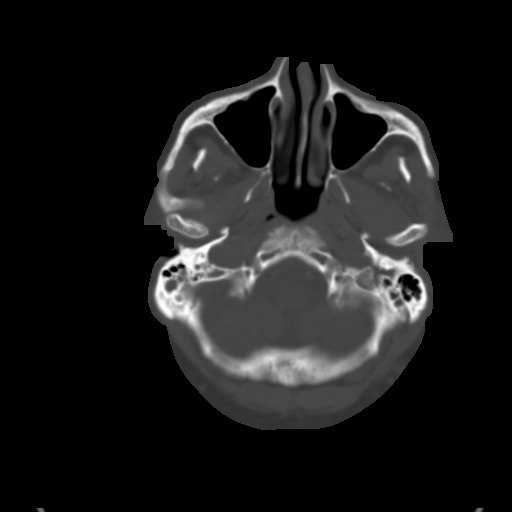
[im 6/36  brain]
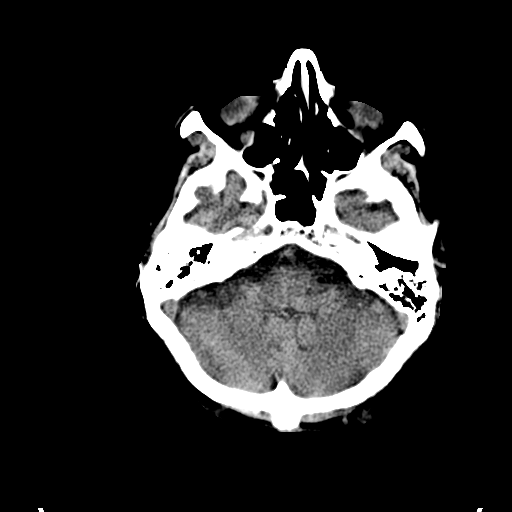
[im 8/36  brain]
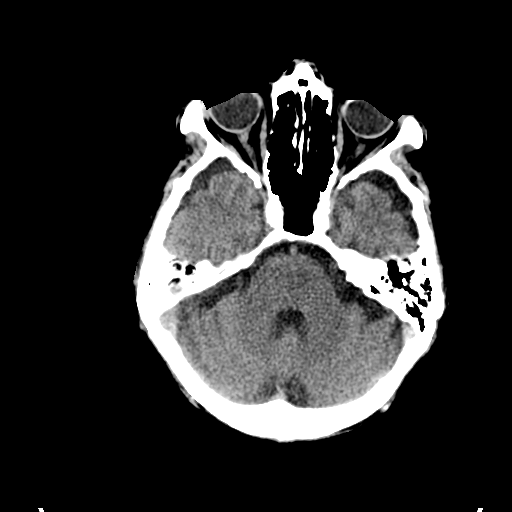
[im 11/36  brain]
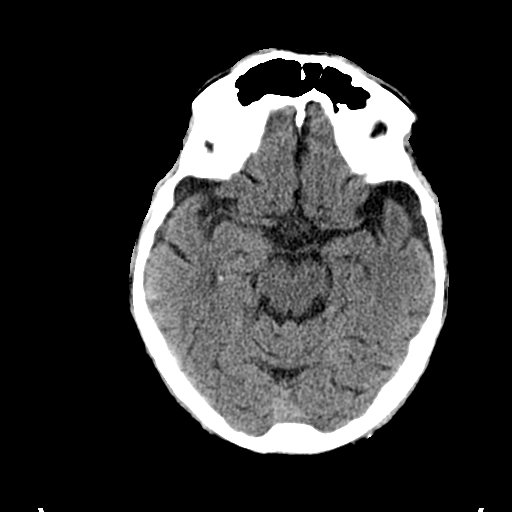
[im 13/36  brain]
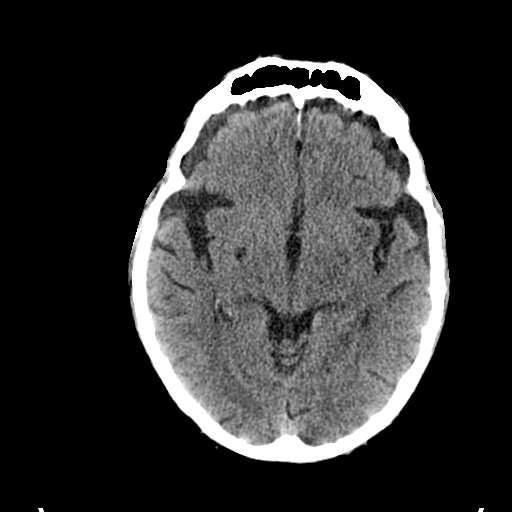
[im 13/36  bone]
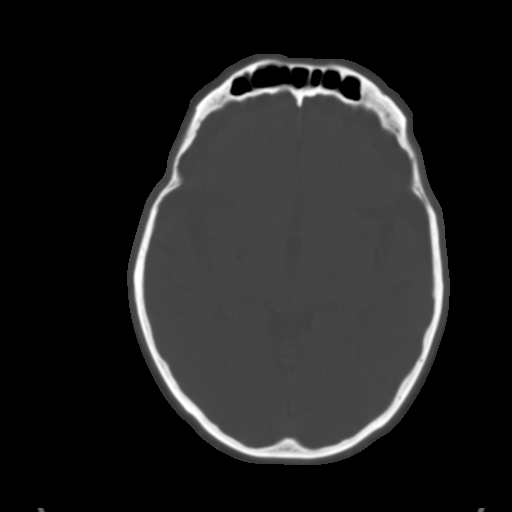
[im 16/36  brain]
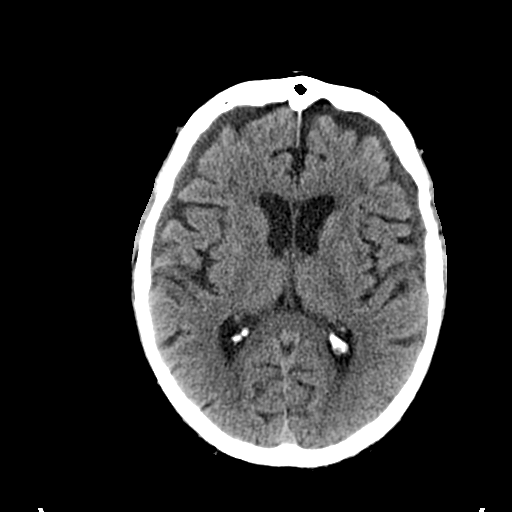
[im 18/36  brain]
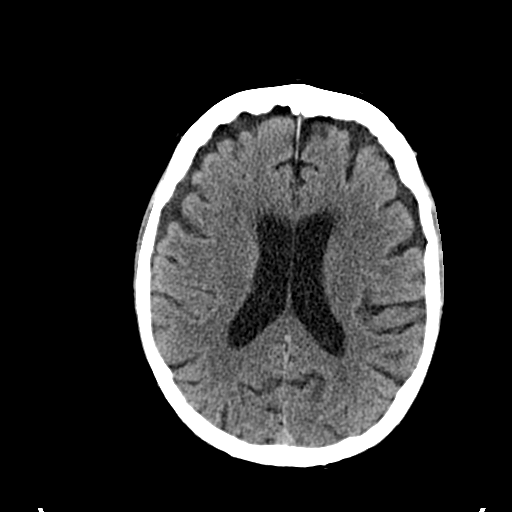
[im 21/36  brain]
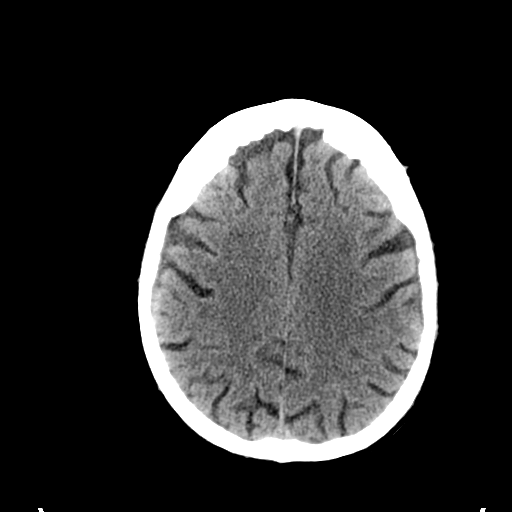
[im 23/36  brain]
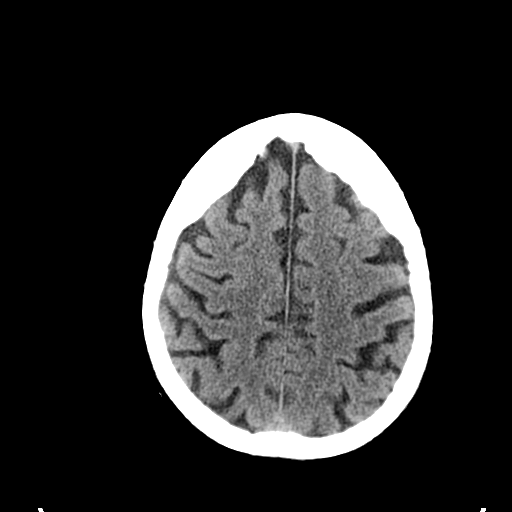
[im 23/36  bone]
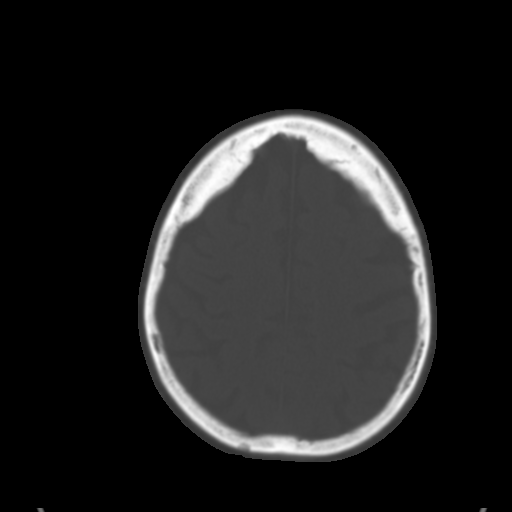
[im 26/36  brain]
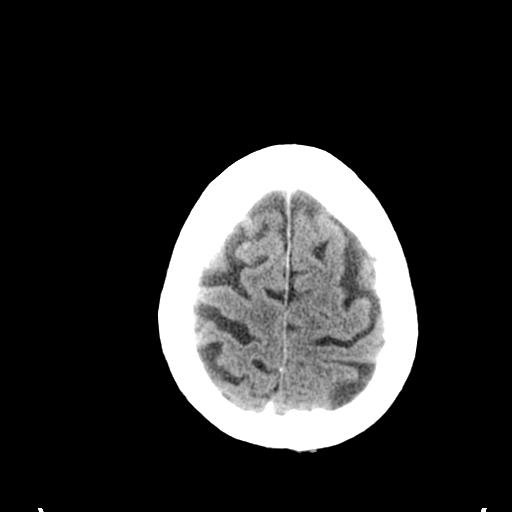
[im 28/36  brain]
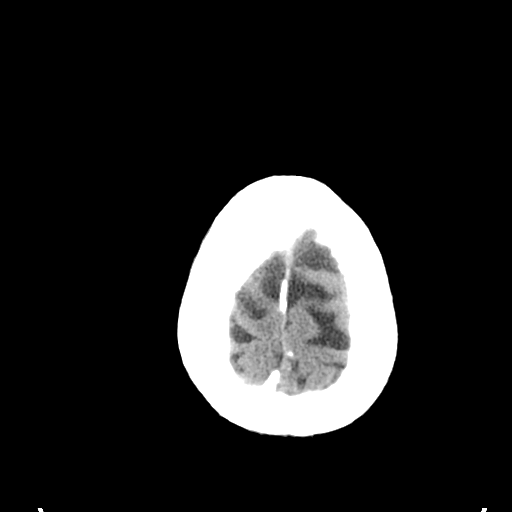
[im 31/36  brain]
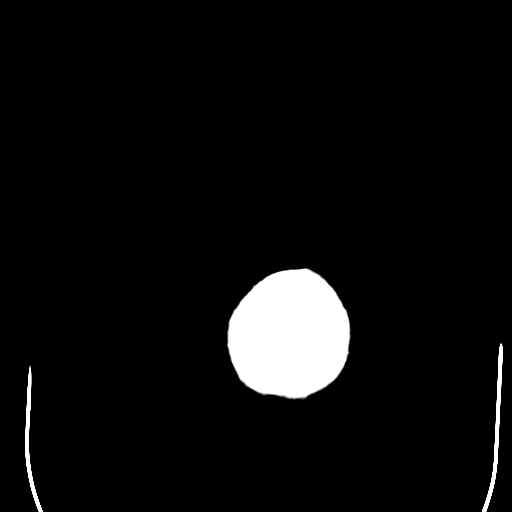
[im 33/36  brain]
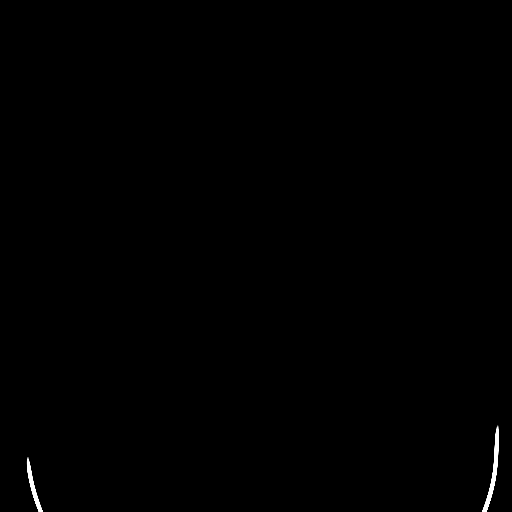
[im 33/36  bone]
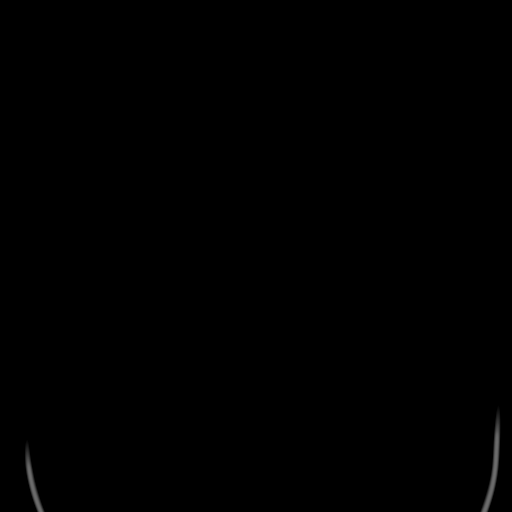

[13 of 30 positions shown; findings below may reference images not displayed]

FINDINGS: There is no evidence for acute stroke, intracranial
hemorrhage, mass lesion, or hydrocephalus.  Bilateral CSF like
extra-axial fluid collections can be seen over the frontal lobes as
well as anterior to the temporal lobes, consistent with subdural
hygromas.  These are age indeterminate.  There is no skull
fracture.  Post infusion, there is no abnormal enhancement of the
brain or visible meninges.

Calvarium intact.  Sinuses clear.  Right mastoid fluid is
indeterminate finding.  Mild vascular calcification.  Previous
cataract extraction.
IMPRESSION: Bilateral CSF like extra-axial fluid collections over the frontal
and temporal regions consistent with subdural hygromas.  These are
age indeterminate.  There is no visible acute infarct, acute
intracranial hemorrhage, skull fracture, or abnormal intracranial
enhancement.

## 2012-02-13 ENCOUNTER — Other Ambulatory Visit: Payer: Self-pay | Admitting: Internal Medicine

## 2012-07-30 ENCOUNTER — Ambulatory Visit
Admission: RE | Admit: 2012-07-30 | Discharge: 2012-07-30 | Disposition: A | Discharge status: Home / Self Care | Payer: Medicare Other | Source: Ambulatory Visit | Attending: Cardiovascular Disease | Admitting: Cardiovascular Disease

## 2012-07-30 ENCOUNTER — Other Ambulatory Visit: Payer: Self-pay | Admitting: Cardiovascular Disease

## 2012-07-30 DIAGNOSIS — I270 Primary pulmonary hypertension: Secondary | ICD-10-CM

## 2012-08-04 ENCOUNTER — Other Ambulatory Visit: Payer: Self-pay | Admitting: Cardiovascular Disease

## 2012-08-05 ENCOUNTER — Encounter (HOSPITAL_COMMUNITY)
Admission: RE | Disposition: A | Discharge status: Home / Self Care | Payer: Self-pay | Source: Ambulatory Visit | Attending: Cardiovascular Disease

## 2012-08-05 ENCOUNTER — Encounter (HOSPITAL_COMMUNITY): Payer: Self-pay | Admitting: General Practice

## 2012-08-05 ENCOUNTER — Ambulatory Visit (HOSPITAL_COMMUNITY)
Admission: RE | Admit: 2012-08-05 | Discharge: 2012-08-06 | Disposition: A | Discharge status: Home / Self Care | Payer: Medicare Other | Source: Ambulatory Visit | Attending: Cardiovascular Disease | Admitting: Cardiovascular Disease

## 2012-08-05 DIAGNOSIS — M1990 Unspecified osteoarthritis, unspecified site: Secondary | ICD-10-CM | POA: Diagnosis present

## 2012-08-05 DIAGNOSIS — I739 Peripheral vascular disease, unspecified: Secondary | ICD-10-CM | POA: Diagnosis present

## 2012-08-05 DIAGNOSIS — I70219 Atherosclerosis of native arteries of extremities with intermittent claudication, unspecified extremity: Secondary | ICD-10-CM | POA: Insufficient documentation

## 2012-08-05 DIAGNOSIS — I10 Essential (primary) hypertension: Secondary | ICD-10-CM | POA: Diagnosis present

## 2012-08-05 DIAGNOSIS — E119 Type 2 diabetes mellitus without complications: Secondary | ICD-10-CM | POA: Diagnosis present

## 2012-08-05 DIAGNOSIS — IMO0001 Reserved for inherently not codable concepts without codable children: Secondary | ICD-10-CM

## 2012-08-05 DIAGNOSIS — I708 Atherosclerosis of other arteries: Secondary | ICD-10-CM | POA: Insufficient documentation

## 2012-08-05 DIAGNOSIS — I4510 Unspecified right bundle-branch block: Secondary | ICD-10-CM | POA: Diagnosis present

## 2012-08-05 HISTORY — DX: Type 2 diabetes mellitus without complications: E11.9

## 2012-08-05 HISTORY — PX: PERIPHERAL ARTERIAL STENT GRAFT: SHX2220

## 2012-08-05 HISTORY — PX: LOWER EXTREMITY ANGIOGRAM: SHX5508

## 2012-08-05 HISTORY — DX: Unspecified osteoarthritis, unspecified site: M19.90

## 2012-08-05 LAB — GLUCOSE, CAPILLARY
Glucose-Capillary: 84 mg/dL (ref 70–99)
Glucose-Capillary: 78 mg/dL (ref 70–99)

## 2012-08-05 SURGERY — ANGIOGRAM, LOWER EXTREMITY
Anesthesia: LOCAL

## 2012-08-05 MED ORDER — SIMVASTATIN 40 MG PO TABS
40.0000 mg | ORAL_TABLET | Freq: Every day | ORAL | Status: DC
  Administered 2012-08-05: 22:00:00 40 mg via ORAL
  Filled 2012-08-05 (×2): qty 1

## 2012-08-05 MED ORDER — CARVEDILOL 6.25 MG PO TABS
6.2500 mg | ORAL_TABLET | Freq: Two times a day (BID) | ORAL | Status: DC
Start: 2012-08-05 — End: 2012-08-06
  Administered 2012-08-05 – 2012-08-06 (×2): 6.25 mg via ORAL
  Filled 2012-08-05 (×4): qty 1

## 2012-08-05 MED ORDER — HEPARIN SODIUM (PORCINE) 5000 UNIT/ML IJ SOLN
5000.0000 [IU] | Freq: Three times a day (TID) | INTRAMUSCULAR | Status: DC
  Administered 2012-08-06: 06:00:00 5000 [IU] via SUBCUTANEOUS
  Filled 2012-08-05 (×4): qty 1

## 2012-08-05 MED ORDER — SODIUM CHLORIDE 0.9 % IJ SOLN: 3.0000 mL | INTRAMUSCULAR | Status: DC | PRN

## 2012-08-05 MED ORDER — CILOSTAZOL 100 MG PO TABS
100.0000 mg | ORAL_TABLET | Freq: Two times a day (BID) | ORAL | Status: DC
  Administered 2012-08-05 – 2012-08-06 (×2): 100 mg via ORAL
  Filled 2012-08-05 (×3): qty 1

## 2012-08-05 MED ORDER — ACETAMINOPHEN 325 MG PO TABS: 650.0000 mg | ORAL_TABLET | ORAL | Status: DC | PRN

## 2012-08-05 MED ORDER — LISINOPRIL 10 MG PO TABS
10.0000 mg | ORAL_TABLET | Freq: Every day | ORAL | Status: DC
  Administered 2012-08-06: 11:00:00 10 mg via ORAL
  Filled 2012-08-05: qty 1

## 2012-08-05 MED ORDER — CLOPIDOGREL BISULFATE 75 MG PO TABS
75.0000 mg | ORAL_TABLET | Freq: Every day | ORAL | Status: DC
  Administered 2012-08-06: 11:00:00 75 mg via ORAL
  Filled 2012-08-05: qty 1

## 2012-08-05 MED ORDER — MORPHINE SULFATE 2 MG/ML IJ SOLN: 1.0000 mg | INTRAMUSCULAR | Status: DC | PRN

## 2012-08-05 MED ORDER — LIDOCAINE HCL (PF) 1 % IJ SOLN
INTRAMUSCULAR | Status: AC
  Filled 2012-08-05: qty 30

## 2012-08-05 MED ORDER — ONDANSETRON HCL 4 MG/2ML IJ SOLN: 4.0000 mg | Freq: Four times a day (QID) | INTRAMUSCULAR | Status: DC | PRN

## 2012-08-05 MED ORDER — SODIUM CHLORIDE 0.9 % IV SOLN
INTRAVENOUS | Status: AC
  Administered 2012-08-05: 11:00:00 via INTRAVENOUS

## 2012-08-05 MED ORDER — SERTRALINE HCL 50 MG PO TABS
50.0000 mg | ORAL_TABLET | Freq: Every day | ORAL | Status: DC
  Administered 2012-08-06: 50 mg via ORAL
  Filled 2012-08-05: qty 1

## 2012-08-05 MED ORDER — HEPARIN (PORCINE) IN NACL 2-0.9 UNIT/ML-% IJ SOLN
INTRAMUSCULAR | Status: AC
  Filled 2012-08-05: qty 1000

## 2012-08-05 MED ORDER — ASPIRIN 81 MG PO CHEW
CHEWABLE_TABLET | ORAL | Status: AC
  Filled 2012-08-05: qty 4

## 2012-08-05 MED ORDER — HEPARIN SODIUM (PORCINE) 1000 UNIT/ML IJ SOLN
INTRAMUSCULAR | Status: AC
  Filled 2012-08-05: qty 1

## 2012-08-05 MED ORDER — SODIUM CHLORIDE 0.9 % IV SOLN
INTRAVENOUS | Status: DC
  Administered 2012-08-05: 1000 mL via INTRAVENOUS

## 2012-08-05 MED ORDER — NITROGLYCERIN 0.2 MG/ML ON CALL CATH LAB
INTRAVENOUS | Status: AC
  Filled 2012-08-05: qty 1

## 2012-08-05 MED ORDER — DIAZEPAM 5 MG PO TABS
5.0000 mg | ORAL_TABLET | ORAL | Status: AC
  Administered 2012-08-05: 5 mg via ORAL
  Filled 2012-08-05: qty 1

## 2012-08-05 NOTE — H&P (Signed)
  H & P will be scanned in.  Pt was reexamined and existing H & P reviewed. No changes found.  Runell Gess, MD [facility] 08/05/2012 9:32 AM

## 2012-08-05 NOTE — Op Note (Signed)
Anthony Marsh is a 78 y.o. male    [id number] LOCATION:  FACILITY: [facility]  PHYSICIAN: Nanetta Batty, M.D. Apr 11, 1923   DATE OF PROCEDURE:  08/05/2012  DATE OF DISCHARGE:  [facility]  PV Intervention    History obtained from chart review. Anthony Marsh is an 78 year old thin appearing widowed t and L. Male father of one, grandfather to 3 grandchildren referred to me by Dr. [place] healthy for peripheral vascular evaluation because of claudication. His primary doctor is Dr. Yetta Flock in [place] . If his factors include hypertension and hyperlipidemia. He had right femoropopliteal bypass grafting in [place] in 2005. He does have bilateral lower extremity lifestyle limiting claudication. Operative performed in the office 05/27/2012 revealed ABIs of 0.8 range bilaterally with intact right femoropopliteal bypass graft, high grade left common iliac and right popliteal stenosis beyond the graft. Patient presents today for angiography and potential percutaneous intervention.   PROCEDURE DESCRIPTION:    The patient was brought to the second floor  [place] [facility] lab in the postabsorptive state. He was premedicated with Valium 5 mg by mouth. His left groin was prepped and shaved in usual sterile fashion. Xylocaine 1% was used for local anesthesia. A 5 French sheath was inserted into the left common femoral artery using standard Seldinger technique. The patient received 5000 units  of heparin  intravenously.  The ACT was measured at 214. Total contrast administered the patient was 142 cc. A 5 French pigtail catheter was used for abdominal aortography with bifemoral runoff using bolus chase digital subtraction step table technique. Visipaque dye was used for the entirety of the case. A pullback gradient was obtained across the ostium of the left common iliac artery with a 5 French endhole catheter after administration of 200 mcg of intra-arterial nitroglycerin. A 5  French crossover catheters used for selective angiography of the right popliteal artery.    HEMODYNAMICS:    AO SYSTOLIC/AO DIASTOLIC: 160/64    ANGIOGRAPHIC RESULTS:   1: Abdominal aortogram-the renal arteries are widely patent. The infrarenal abdominal aorta with moderately calcified fluoroscopically without evidence of obstruction or aneurysmal dilatation.  2: Left lower extremity-50% ostial left common iliac artery stenosis with a 40-50 mm pullback gradient noted. There was a 90% calcified distal left SFA stenosis within the adductor canal there was segmental. There was one vessel runoff via the peroneal.  3: Right lower extremity-50% ostial right common iliac artery stenosis. The right SFA was occluded in its mid midportion and diffusely diseased in its proximal portion. The right femoropopliteal bypass graft is widely patent and anastomosed in the above-the-knee popliteal. There was high-grade calcific stenoses at the anastomosis and then the above and below the knee popliteal artery with one one vessel runoff via the peroneal.   IMPRESSION:Anthony Marsh has a physiologically significant ostial left common iliac artery stenosis with a 50 mm pullback gradient. We'll proceed with PTA and stenting using an 8 mm x 24 mm long Palmaz balloon-expandable stent.   Procedure description: The 5 French sheath in the left common femoral artery was exchanged over an 035 wire for a 6 French Bright Tip Sheath  . The patient received 5000 units of heparin intravenously with an ACT documented at 214 .primary stenting was performed with an 8 mm x 24 mm long Palmaz Balloon expandable stent at 10 atmospheres resulting in reduction of a 50% ostial left common iliac artery stenosis to 0% residual. Completion and aortography was performed demonstrating exact placement at the ostium without  any encroachment on the right side.  Overall impression: Successful PTA and stenting of the ostium of the left common iliac  artery. Patient does have residual disease there is calcified in his left SFA as well as in his right above the knee and below the popliteal beyond the femoropopliteal bypass graft insertion. He also has what appears to be physiologically significant lesion at the origin of his right common iliac artery. He'll 2 with aspirin Plavix, hydrated overnight and discharged home the morning. His left common femoral arterial puncture site was sealed with the "[facility]  device" with excellent hemostasis.  Runell Gess MD, [facility] 08/05/2012 10:32 AM

## 2012-08-06 DIAGNOSIS — IMO0001 Reserved for inherently not codable concepts without codable children: Secondary | ICD-10-CM

## 2012-08-06 DIAGNOSIS — M1990 Unspecified osteoarthritis, unspecified site: Secondary | ICD-10-CM | POA: Diagnosis present

## 2012-08-06 DIAGNOSIS — I4510 Unspecified right bundle-branch block: Secondary | ICD-10-CM | POA: Diagnosis present

## 2012-08-06 DIAGNOSIS — I739 Peripheral vascular disease, unspecified: Secondary | ICD-10-CM | POA: Diagnosis present

## 2012-08-06 LAB — CBC
MCH: 32.1 pg (ref 26.0–34.0)
MCHC: 34.5 g/dL (ref 30.0–36.0)
Platelets: 225 10*3/uL (ref 150–400)

## 2012-08-06 LAB — BASIC METABOLIC PANEL
BUN: 24 mg/dL — ABNORMAL HIGH (ref 6–23)
Calcium: 9.2 mg/dL (ref 8.4–10.5)
Creatinine, Ser: 1.06 mg/dL (ref 0.50–1.35)
GFR calc non Af Amer: 61 mL/min — ABNORMAL LOW (ref >90)
Glucose, Bld: 104 mg/dL — ABNORMAL HIGH (ref 70–99)

## 2012-08-06 LAB — GLUCOSE, CAPILLARY: Glucose-Capillary: 101 mg/dL — ABNORMAL HIGH (ref 70–99)

## 2012-08-06 MED ORDER — ASPIRIN 81 MG PO CHEW: 81.0000 mg | CHEWABLE_TABLET | Freq: Every day | ORAL | Status: DC

## 2012-08-06 MED ORDER — ASPIRIN 81 MG PO CHEW
81.0000 mg | CHEWABLE_TABLET | Freq: Every day | ORAL | Status: DC
  Administered 2012-08-06: 81 mg via ORAL
  Filled 2012-08-06: qty 1

## 2012-08-06 MED ORDER — ACETAMINOPHEN 325 MG PO TABS: 650.0000 mg | ORAL_TABLET | ORAL | Status: DC | PRN

## 2012-08-06 NOTE — Progress Notes (Signed)
Subjective:  No complaints  Objective:  Vital Signs in the last 24 hours: Temp:  [97.6 F (36.4 C)-98.6 F (37 C)] 97.7 F (36.5 C) (10/23 0753) Pulse Rate:  [54-71] 65  (10/23 0753) Resp:  [12-17] 17  (10/23 0753) BP: (105-149)/(47-71) 135/56 mmHg (10/23 0753) SpO2:  [96 %-98 %] 96 % (10/23 0753) Weight:  [70.3 kg (154 lb 15.7 oz)] 70.3 kg (154 lb 15.7 oz) (10/23 0410)  Intake/Output from previous day:  Intake/Output Summary (Last 24 hours) at 08/06/12 0852 Last data filed at 08/06/12 0600  Gross per 24 hour  Intake 1167.5 ml  Output   1475 ml  Net -307.5 ml    Physical Exam: General appearance: alert, cooperative and no distress Lungs: clear to auscultation bilaterally Heart: regular rate and rhythm Lt groin without hematoma   Rate: 65  Rhythm: normal sinus rhythm  Lab Results:  Basename 08/06/12 0455  WBC 10.5  HGB 10.8*  PLT 225    Basename 08/06/12 0455  NA 138  K 4.1  CL 104  CO2 25  GLUCOSE 104*  BUN 24*  CREATININE 1.06   No results found for this basename: TROPONINI:2,CK,MB:2 in the last 72 hours Hepatic Function Panel No results found for this basename: PROT,ALBUMIN,AST,ALT,ALKPHOS,BILITOT,BILIDIR,IBILI in the last 72 hours No results found for this basename: CHOL in the last 72 hours No results found for this basename: INR in the last 72 hours  Imaging: Imaging results have been reviewed  Cardiac Studies:  Assessment/Plan:   Principal Problem:  *Claudication of lower extremity Active Problems:  Diabetes mellitus, diet controlled  HTN (hypertension)  PVD S/P Lt CIA pta/ stent 08/05/12  DJD (degenerative joint disease). C-spine surgery Nov 2012  RBBB  Negative Myoview Oct 2013   Plan- Discharge today, follow up OP dopplers and follow up with Dr Allyson Sabal.   Corine Shelter PA-C 08/06/2012, 8:52 AM

## 2012-08-06 NOTE — Discharge Summary (Signed)
Patient ID: Anthony Marsh,  MRN: [id number], DOB/AGE: Apr 26, 1923 78 y.o.  Admit date: 08/05/2012 Discharge date: 08/06/2012  Primary Care Provider: Dr Jetty Duhamel Primary Cardiologist: Dr Rennis Golden  Discharge Diagnoses Principal Problem:  *Claudication of lower extremity Active Problems:  Diabetes mellitus, diet controlled  HTN (hypertension)  PVD S/P Lt CIA pta/ stent 08/05/12  DJD (degenerative joint disease). C-spine surgery Nov 2012  RBBB  Negative Myoview Oct 2013    Procedures: PVA and Lt CIA PTA/stenting 08/05/12   Hospital Course:   Anthony Marsh is an 78 year old thin appearing widowed male father of one, grandfather to 3 grandchildren referred to Dr Allyson Sabal by Dr. [place] Hilty for peripheral vascular evaluation because of claudication. His primary doctor is Dr. Yetta Flock in [place] . His factors include hypertension and hyperlipidemia. He has had a prior right femoropopliteal bypass grafting in [place] in 2005. He does have bilateral lower extremity lifestyle limiting claudication. Dopplers  performed in the office 05/27/2012 revealed ABIs of 0.8 range bilaterally with intact right femoropopliteal bypass graft, high grade left common iliac and right popliteal stenosis beyond the graft. OP Myoview was low risk. The pt was admitted for PVA 08/05/12. This revealed LCIA disease and he underwent LCIA PTA and stenting. Se Dr Hazle Coca OP note for complete details. He does have residual Rt LE disease. He is stable on the morning of the 23d and will be discharged. He will be set for OP dopplers and a follow up with Dr Allyson Sabal.   Discharge Vitals:  Blood pressure 135/56, pulse 65, temperature 97.7 F (36.5 C), temperature source Oral, resp. rate 17, height 5\' 8"  (1.727 m), weight 70.3 kg (154 lb 15.7 oz), SpO2 96.00%.    Labs: Results for orders placed during the hospital encounter of 08/05/12 (from the past 48 hour(s))  GLUCOSE, CAPILLARY     Status: Normal   Collection Time   08/05/12 10:37 AM      Component Value Range Comment   Glucose-Capillary 84  70 - 99 mg/dL   GLUCOSE, CAPILLARY     Status: Normal   Collection Time   08/05/12 12:58 PM      Component Value Range Comment   Glucose-Capillary 93  70 - 99 mg/dL   GLUCOSE, CAPILLARY     Status: Normal   Collection Time   08/05/12  5:25 PM      Component Value Range Comment   Glucose-Capillary 78  70 - 99 mg/dL   GLUCOSE, CAPILLARY     Status: Abnormal   Collection Time   08/05/12  9:24 PM      Component Value Range Comment   Glucose-Capillary 143 (*) 70 - 99 mg/dL    Comment 1 Documented in Chart      Comment 2 Notify RN     BASIC METABOLIC PANEL     Status: Abnormal   Collection Time   08/06/12  4:55 AM      Component Value Range Comment   Sodium 138  135 - 145 mEq/L    Potassium 4.1  3.5 - 5.1 mEq/L    Chloride 104  96 - 112 mEq/L    CO2 25  19 - 32 mEq/L    Glucose, Bld 104 (*) 70 - 99 mg/dL    BUN 24 (*) 6 - 23 mg/dL    Creatinine, Ser 4.09  0.50 - 1.35 mg/dL    Calcium 9.2  8.4 - 81.1 mg/dL    GFR calc non Af Amer 61 (*) >90 mL/min  GFR calc Af Amer 71 (*) >90 mL/min   CBC     Status: Abnormal   Collection Time   08/06/12  4:55 AM      Component Value Range Comment   WBC 10.5  4.0 - 10.5 K/uL    RBC 3.36 (*) 4.22 - 5.81 MIL/uL    Hemoglobin 10.8 (*) 13.0 - 17.0 g/dL    HCT 16.1 (*) 09.6 - 52.0 %    MCV 93.2  78.0 - 100.0 fL    MCH 32.1  26.0 - 34.0 pg    MCHC 34.5  30.0 - 36.0 g/dL    RDW 04.5  40.9 - 81.1 %    Platelets 225  150 - 400 K/uL   GLUCOSE, CAPILLARY     Status: Abnormal   Collection Time   08/06/12  7:51 AM      Component Value Range Comment   Glucose-Capillary 101 (*) 70 - 99 mg/dL     Disposition: Discharged home in stable condition   Discharge Medications:    Medication List     As of 08/06/2012  8:53 AM    ASK your doctor about these medications         carvedilol 6.25 MG tablet   Commonly known as: COREG   Take 6.25 mg by mouth 2 (two) times daily  with a meal. Pt takes this medication at 8 am and 8 pm      cilostazol 100 MG tablet   Commonly known as: PLETAL   Take 100 mg by mouth 2 (two) times daily.      clopidogrel 75 MG tablet   Commonly known as: PLAVIX   Take 75 mg by mouth daily.      lisinopril 10 MG tablet   Commonly known as: PRINIVIL,ZESTRIL   Take 10 mg by mouth daily.      multivitamins ther. w/minerals Tabs   Take 1 tablet by mouth daily.      sertraline 100 MG tablet   Commonly known as: ZOLOFT   Take 50 mg by mouth daily.      simvastatin 40 MG tablet   Commonly known as: ZOCOR   Take 40 mg by mouth at bedtime.        Duration of Discharge Encounter: Greater than 30 minutes including physician time.  Jolene Provost PA-C 08/06/2012 8:53 AM

## 2012-08-06 NOTE — Progress Notes (Signed)
Pt. Seen and examined. Agree with the NP/PA-C note as written.  Feels well today, no complaints. No leg pain, Groin is without hematoma or bruit. Vitals stable. Exam benign. Okay for discharge home today. Follow-up with Dr. Allyson Sabal.  Chrystie Nose, MD, [facility] Attending Cardiologist [facility]

## 2012-09-09 ENCOUNTER — Other Ambulatory Visit: Payer: Self-pay | Admitting: Cardiovascular Disease

## 2012-09-29 ENCOUNTER — Encounter (HOSPITAL_COMMUNITY): Payer: Self-pay

## 2012-09-30 ENCOUNTER — Encounter (HOSPITAL_COMMUNITY)
Admission: RE | Disposition: A | Discharge status: Home / Self Care | Payer: Self-pay | Source: Ambulatory Visit | Attending: Cardiovascular Disease

## 2012-09-30 ENCOUNTER — Ambulatory Visit (HOSPITAL_COMMUNITY)
Admission: RE | Admit: 2012-09-30 | Discharge: 2012-10-01 | Disposition: A | Discharge status: Home / Self Care | Payer: Medicare Other | Source: Ambulatory Visit | Attending: Cardiovascular Disease | Admitting: Cardiovascular Disease

## 2012-09-30 ENCOUNTER — Encounter (HOSPITAL_COMMUNITY): Payer: Self-pay | Admitting: General Practice

## 2012-09-30 DIAGNOSIS — IMO0001 Reserved for inherently not codable concepts without codable children: Secondary | ICD-10-CM

## 2012-09-30 DIAGNOSIS — I4510 Unspecified right bundle-branch block: Secondary | ICD-10-CM | POA: Diagnosis present

## 2012-09-30 DIAGNOSIS — I739 Peripheral vascular disease, unspecified: Secondary | ICD-10-CM | POA: Diagnosis present

## 2012-09-30 DIAGNOSIS — J449 Chronic obstructive pulmonary disease, unspecified: Secondary | ICD-10-CM | POA: Diagnosis present

## 2012-09-30 DIAGNOSIS — E119 Type 2 diabetes mellitus without complications: Secondary | ICD-10-CM | POA: Diagnosis present

## 2012-09-30 DIAGNOSIS — M1990 Unspecified osteoarthritis, unspecified site: Secondary | ICD-10-CM | POA: Diagnosis present

## 2012-09-30 DIAGNOSIS — I70219 Atherosclerosis of native arteries of extremities with intermittent claudication, unspecified extremity: Secondary | ICD-10-CM | POA: Insufficient documentation

## 2012-09-30 DIAGNOSIS — I10 Essential (primary) hypertension: Secondary | ICD-10-CM | POA: Insufficient documentation

## 2012-09-30 DIAGNOSIS — E785 Hyperlipidemia, unspecified: Secondary | ICD-10-CM | POA: Insufficient documentation

## 2012-09-30 HISTORY — PX: ATHERECTOMY: SHX5502

## 2012-09-30 HISTORY — PX: LOWER EXTREMITY ANGIOGRAM: SHX5508

## 2012-09-30 HISTORY — PX: ATHERECTOMY: SHX47

## 2012-09-30 LAB — POCT ACTIVATED CLOTTING TIME
Activated Clotting Time: 214 seconds
Activated Clotting Time: 231 seconds
Activated Clotting Time: 258 seconds

## 2012-09-30 LAB — GLUCOSE, CAPILLARY
Glucose-Capillary: 91 mg/dL (ref 70–99)
Glucose-Capillary: 84 mg/dL (ref 70–99)

## 2012-09-30 SURGERY — ATHERECTOMY
Anesthesia: LOCAL

## 2012-09-30 MED ORDER — NITROGLYCERIN 0.2 MG/ML ON CALL CATH LAB
INTRAVENOUS | Status: AC
  Filled 2012-09-30: qty 1

## 2012-09-30 MED ORDER — LIDOCAINE HCL (PF) 1 % IJ SOLN
INTRAMUSCULAR | Status: AC
  Filled 2012-09-30: qty 30

## 2012-09-30 MED ORDER — SIMVASTATIN 40 MG PO TABS
40.0000 mg | ORAL_TABLET | Freq: Every day | ORAL | Status: DC
  Filled 2012-09-30 (×2): qty 1

## 2012-09-30 MED ORDER — LISINOPRIL 10 MG PO TABS: 10.0000 mg | ORAL_TABLET | Freq: Every day | ORAL | Status: DC

## 2012-09-30 MED ORDER — ACETAMINOPHEN 325 MG PO TABS: 650.0000 mg | ORAL_TABLET | ORAL | Status: DC | PRN

## 2012-09-30 MED ORDER — DIAZEPAM 5 MG PO TABS: 5.0000 mg | ORAL_TABLET | ORAL | Status: DC

## 2012-09-30 MED ORDER — CILOSTAZOL 100 MG PO TABS
100.0000 mg | ORAL_TABLET | Freq: Every day | ORAL | Status: DC
  Administered 2012-10-01: 100 mg via ORAL
  Filled 2012-09-30 (×2): qty 1

## 2012-09-30 MED ORDER — SODIUM CHLORIDE 0.9 % IV SOLN: INTRAVENOUS | Status: AC

## 2012-09-30 MED ORDER — ONDANSETRON HCL 4 MG/2ML IJ SOLN: 4.0000 mg | Freq: Four times a day (QID) | INTRAMUSCULAR | Status: DC | PRN

## 2012-09-30 MED ORDER — SODIUM CHLORIDE 0.9 % IV SOLN
INTRAVENOUS | Status: DC
  Administered 2012-09-30: 08:00:00 via INTRAVENOUS

## 2012-09-30 MED ORDER — ASPIRIN 81 MG PO CHEW
81.0000 mg | CHEWABLE_TABLET | Freq: Every day | ORAL | Status: DC
  Administered 2012-10-01: 81 mg via ORAL
  Filled 2012-09-30: qty 1

## 2012-09-30 MED ORDER — SODIUM CHLORIDE 0.9 % IJ SOLN: 3.0000 mL | INTRAMUSCULAR | Status: DC | PRN

## 2012-09-30 MED ORDER — HEPARIN SODIUM (PORCINE) 1000 UNIT/ML IJ SOLN
INTRAMUSCULAR | Status: AC
  Filled 2012-09-30: qty 1

## 2012-09-30 MED ORDER — VERAPAMIL HCL 2.5 MG/ML IV SOLN
INTRAVENOUS | Status: AC
  Filled 2012-09-30: qty 2

## 2012-09-30 MED ORDER — MORPHINE SULFATE 2 MG/ML IJ SOLN: 1.0000 mg | INTRAMUSCULAR | Status: DC | PRN

## 2012-09-30 MED ORDER — LISINOPRIL 10 MG PO TABS
10.0000 mg | ORAL_TABLET | Freq: Every day | ORAL | Status: DC
  Administered 2012-10-01: 09:00:00 10 mg via ORAL
  Filled 2012-09-30 (×2): qty 1

## 2012-09-30 MED ORDER — CLOPIDOGREL BISULFATE 75 MG PO TABS
75.0000 mg | ORAL_TABLET | Freq: Every day | ORAL | Status: DC
  Administered 2012-10-01: 09:00:00 75 mg via ORAL
  Filled 2012-09-30: qty 1

## 2012-09-30 MED ORDER — OXYCODONE-ACETAMINOPHEN 5-325 MG PO TABS: 1.0000 | ORAL_TABLET | Freq: Four times a day (QID) | ORAL | Status: DC | PRN

## 2012-09-30 MED ORDER — CARVEDILOL 6.25 MG PO TABS
6.2500 mg | ORAL_TABLET | Freq: Two times a day (BID) | ORAL | Status: DC
  Administered 2012-09-30 – 2012-10-01 (×2): 6.25 mg via ORAL
  Filled 2012-09-30 (×4): qty 1

## 2012-09-30 MED ORDER — SERTRALINE HCL 50 MG PO TABS
50.0000 mg | ORAL_TABLET | Freq: Every day | ORAL | Status: DC
  Administered 2012-10-01: 50 mg via ORAL
  Filled 2012-09-30 (×2): qty 1

## 2012-09-30 NOTE — H&P (Signed)
  H & P will be scanned in.  Pt was reexamined and existing H & P reviewed. No changes found.  Runell Gess, MD [facility] 09/30/2012 9:58 AM

## 2012-09-30 NOTE — Op Note (Signed)
Anthony Marsh is a 78 y.o. male    [id number] LOCATION:  FACILITY: [facility]  PHYSICIAN: Nanetta Batty, M.D. 05-31-1923   DATE OF PROCEDURE:  09/30/2012  DATE OF DISCHARGE:  [facility]  CARDIAC CATHETERIZATION     History obtained from chart review. Anthony Marsh is an 78 year old thin appearing widowed Latino male father of one, grandfather to 3 grandchildren referred to me by Dr. [place] Hilty for peripheral vascular evaluation because of claudication. Time her doctor as is Dr. Yetta Flock in [place]. His risk factors include hypertension and hyperlipidemia. He's had a femoropopliteal bypass graft procedure in [place]. He has bilateral lifestyle limiting Lotrimin claudication. If the Dopplers the office that revealed intact right femoropopliteal bypass graft with high-grade disease in both SFAs. I performed diagnostic peripheral angiography on him in the left femoral approach 08/05/2012 revealing a hemodynamically significant proximal left common iliac artery which was stented, and intact right femoropopliteal bypass graft with calcified disease at the distal anastomosis and in the pump and the popliteal. He also had highly calcified distal left SFA and above and we'll popliteal disease with one vessel runoff. It is now for attempt at [facility] orbital rotational atherectomy plus or minus PTA and stenting of this lesion for lifestyle limiting claudication.   PROCEDURE DESCRIPTION:    The patient was brought to the second floor  [facility] cath lab in the postabsorptive state. He was premedicated with Valium 5 mg by mouth. His right and left groinWere prepped and shaved in usual sterile fashion. Xylocaine 1% was used for local anesthesia. A 6 French Ansell sheath was inserted into the right common femoral  artery using standard Seldinger technique.  Attempts were made to cross the bifurcation with a 5 [place] crossover catheter and Rosen wire  however I was never able able to push the sheath across the bifurcation and therefore an antegrade approach was performed. I then accessed the left common femoral artery antegrade using a micropuncture needle. Following this a 6 French right hip sheath was then inserted over a 035 wire into the left SFA and an antegrade approach.The patient received 8000 units  of heparin  intravenously.  The final ACT was measured at 250. A total of 73 cc of contrast was administered to the patient.  Following this the calcified lesions in the distal left SFA above-knee popliteal were crossed with an 014 Rigali wire and an 035 cross catheter. The cross catheter then was used to cross the calcified lesions and a [place]  wire was exchanged for an 014 Viper wire. A 6 mm NAV 6 distal protection device was then advanced over the guidewire into the below-the-knee popliteal artery. Following this orbital atherectomy was performed with a 2 mm Stealth predator per 220,000 rpm's.generous amounts of intra-arterial nitroglycerin was administered. Angiography after this suggested that the filter was obstructed with "" embo dust". Following this angioplasty was performed with a 6 mm millimeter by 60 mm long chocolate balloon at 6 atmospheres for 120 sec. The distal protection device was then recaptured and completion angiography was performed. The final angiographic result reduction of a 90% highly calcified fairly focal lesion in the distal SFA and in the above-the-knee popliteal to less than 20 and 30% residual without dissection and with excellent flow. There was excellent flow down the peroneal which was his only runoff. The patient tolerated the procedure well.    HEMODYNAMICS:    AO SYSTOLIC/AO DIASTOLIC: 159/68     IMPRESSION:successful antegrade approach  diamondback orbital rotational atherectomy, PTA using the "chocolate"  balloon for  high grade calcified distal left SFA and above-the-knee popliteal lesion for lifestyle  limiting claudication. The sheaths will be removed once ACT falls below 170 a pressure will be held on the groin to achieve hemostasis. The patient will be hydrated overnight and treated with aspirin Plavix, and discharged in the morning. He left the Cath Lab in stable condition.  Runell Gess MD, [facility] 09/30/2012 11:59 AM

## 2012-10-01 DIAGNOSIS — J449 Chronic obstructive pulmonary disease, unspecified: Secondary | ICD-10-CM | POA: Diagnosis present

## 2012-10-01 LAB — CBC
Hemoglobin: 10.2 g/dL — ABNORMAL LOW (ref 13.0–17.0)
MCHC: 34 g/dL (ref 30.0–36.0)
RDW: 13 % (ref 11.5–15.5)
WBC: 10 10*3/uL (ref 4.0–10.5)

## 2012-10-01 LAB — BASIC METABOLIC PANEL
BUN: 23 mg/dL (ref 6–23)
Creatinine, Ser: 0.98 mg/dL (ref 0.50–1.35)
GFR calc Af Amer: 83 mL/min — ABNORMAL LOW (ref >90)
GFR calc non Af Amer: 72 mL/min — ABNORMAL LOW (ref >90)
Potassium: 3.9 mEq/L (ref 3.5–5.1)

## 2012-10-01 NOTE — Progress Notes (Signed)
Subjective:  No complaints, "my leg feels great!"  Objective:  Vital Signs in the last 24 hours: Temp:  [98 F (36.7 C)-98.7 F (37.1 C)] 98 F (36.7 C) (12/18 0815) Pulse Rate:  [61-78] 64  (12/18 0815) Resp:  [14-18] 18  (12/18 0815) BP: (121-157)/(47-74) 151/65 mmHg (12/18 0815) SpO2:  [94 %-99 %] 99 % (12/18 0815) Weight:  [64.6 kg (142 lb 6.7 oz)] 64.6 kg (142 lb 6.7 oz) (12/18 0031)  Intake/Output from previous day:  Intake/Output Summary (Last 24 hours) at 10/01/12 1019 Last data filed at 10/01/12 0505  Gross per 24 hour  Intake 671.25 ml  Output    701 ml  Net -29.75 ml    Physical Exam: General appearance: alert, cooperative and no distress Lungs: clear to auscultation bilaterally Heart: regular rate and rhythm Both groins without hematoma or echymosis   Rate: 66  Rhythm: normal sinus rhythm  Lab Results:  Basename 10/01/12 0417  WBC 10.0  HGB 10.2*  PLT 196    Basename 10/01/12 0417  NA 138  K 3.9  CL 104  CO2 25  GLUCOSE 94  BUN 23  CREATININE 0.98   No results found for this basename: TROPONINI:2,CK,MB:2 in the last 72 hours Hepatic Function Panel No results found for this basename: PROT,ALBUMIN,AST,ALT,ALKPHOS,BILITOT,BILIDIR,IBILI in the last 72 hours No results found for this basename: CHOL in the last 72 hours No results found for this basename: INR in the last 72 hours  Imaging: Imaging results have been reviewed  Cardiac Studies:  Assessment/Plan:   Active Problems:  PVD S/P Lt CIA pta/ stent 08/05/12, Lt SFA PTA/HSRA 10/01/12  Claudication of lower extremity  Diabetes mellitus, diet controlled  HTN (hypertension)  DJD (degenerative joint disease). C-spine surgery Nov 2012  RBBB  Negative Myoview Oct 2013  COPD (chronic obstructive pulmonary disease) by history and CXR   Plan- OK to discharge, follow up with Dr Allyson Sabal after OP dopplers.   Corine Shelter PA-C 10/01/2012, 10:19 AM

## 2012-10-01 NOTE — Discharge Summary (Signed)
Patient ID: Anthony Marsh,  MRN: [id number], DOB/AGE: 76-Nov-1924 76 y.o.  Admit date: 09/30/2012 Discharge date: 10/01/2012  Primary Care Provider: Dr Yetta Flock Primary Cardiologist: Dr Rennis Golden  Discharge Diagnoses Active Problems:  PVD S/P Lt CIA pta/ stent 08/05/12, Lt SFA PTA/HSRA 10/01/12  Claudication of lower extremity  Diabetes mellitus, diet controlled  HTN (hypertension)  DJD (degenerative joint disease). C-spine surgery Nov 2012  RBBB  Negative Myoview Oct 2013  COPD (chronic obstructive pulmonary disease) by history and CXR    Procedures:   PVA/HSRA/PTA Lt SFA 09/30/12   Hospital Course:  Anthony Marsh is an 78 year old thin appearing widowed Latino male father of one, grandfather to 3 grandchildren referred to Dr Allyson Sabal by Dr. [place] Hilty for peripheral vascular evaluation because of claudication. His primary care doctor is Dr. Yetta Flock in [place]. His risk factors include hypertension and hyperlipidemia. He's had a femoropopliteal bypass graft procedure in [place]. He has bilateral lifestyle limiting Lotrimin claudication. OP Dopplers at the office that revealed intact right femoropopliteal bypass graft with high-grade disease in both SFAs. Dr Allyson Sabal performed diagnostic peripheral angiography on him in the left femoral approach 08/05/2012 revealing a hemodynamically significant proximal left common iliac artery which was stented, and intact right femoropopliteal bypass graft with calcified disease at the distal anastomosis and in the pump and the popliteal. He also had highly calcified distal left SFA and popliteal disease with one vessel runoff. He was admitted 09/30/12 for an attempt at [facility] orbital rotational atherectomy plus or minus PTA and stenting of the Lt SFA for lifestyle limiting claudication. This was performed on 09/30/12 by Dr Allyson Sabal.The patient tolerated this well. He is stable for discharge 10/01/12 and will follow up with Dr Allyson Sabal after LE  dopplers as an OP.   Discharge Vitals:  Blood pressure 151/65, pulse 64, temperature 98 F (36.7 C), temperature source Oral, resp. rate 18, height 5\' 8"  (1.727 m), weight 64.6 kg (142 lb 6.7 oz), SpO2 99.00%.    Labs: Results for orders placed during the hospital encounter of 09/30/12 (from the past 48 hour(s))  POCT ACTIVATED CLOTTING TIME     Status: Normal   Collection Time   09/30/12 11:04 AM      Component Value Range Comment   Activated Clotting Time 214     POCT ACTIVATED CLOTTING TIME     Status: Normal   Collection Time   09/30/12 11:33 AM      Component Value Range Comment   Activated Clotting Time 258     POCT ACTIVATED CLOTTING TIME     Status: Normal   Collection Time   09/30/12 12:14 PM      Component Value Range Comment   Activated Clotting Time 231     GLUCOSE, CAPILLARY     Status: Normal   Collection Time   09/30/12 12:18 PM      Component Value Range Comment   Glucose-Capillary 91  70 - 99 mg/dL   POCT ACTIVATED CLOTTING TIME     Status: Normal   Collection Time   09/30/12  1:19 PM      Component Value Range Comment   Activated Clotting Time 198     POCT ACTIVATED CLOTTING TIME     Status: Normal   Collection Time   09/30/12  2:05 PM      Component Value Range Comment   Activated Clotting Time 181     GLUCOSE, CAPILLARY     Status: Normal   Collection Time  09/30/12  4:16 PM      Component Value Range Comment   Glucose-Capillary 91  70 - 99 mg/dL   GLUCOSE, CAPILLARY     Status: Normal   Collection Time   09/30/12  9:39 PM      Component Value Range Comment   Glucose-Capillary 84  70 - 99 mg/dL    Comment 1 Documented in Chart      Comment 2 Notify RN     BASIC METABOLIC PANEL     Status: Abnormal   Collection Time   10/01/12  4:17 AM      Component Value Range Comment   Sodium 138  135 - 145 mEq/L    Potassium 3.9  3.5 - 5.1 mEq/L    Chloride 104  96 - 112 mEq/L    CO2 25  19 - 32 mEq/L    Glucose, Bld 94  70 - 99 mg/dL    BUN 23  6 -  23 mg/dL    Creatinine, Ser 1.61  0.50 - 1.35 mg/dL    Calcium 8.9  8.4 - 09.6 mg/dL    GFR calc non Af Amer 72 (*) >90 mL/min    GFR calc Af Amer 83 (*) >90 mL/min   CBC     Status: Abnormal   Collection Time   10/01/12  4:17 AM      Component Value Range Comment   WBC 10.0  4.0 - 10.5 K/uL    RBC 3.25 (*) 4.22 - 5.81 MIL/uL    Hemoglobin 10.2 (*) 13.0 - 17.0 g/dL    HCT 04.5 (*) 40.9 - 52.0 %    MCV 92.3  78.0 - 100.0 fL    MCH 31.4  26.0 - 34.0 pg    MCHC 34.0  30.0 - 36.0 g/dL    RDW 81.1  91.4 - 78.2 %    Platelets 196  150 - 400 K/uL   GLUCOSE, CAPILLARY     Status: Normal   Collection Time   10/01/12  8:13 AM      Component Value Range Comment   Glucose-Capillary 97  70 - 99 mg/dL     Disposition:  Follow-up Information    Follow up with Runell Gess, MD. (office will call you)    Contact information:   [address] [place] [address] [phone]          Discharge Medications:    Medication List     As of 10/01/2012 10:12 AM    TAKE these medications         acetaminophen 325 MG tablet   Commonly known as: TYLENOL   Take 2 tablets (650 mg total) by mouth every 4 (four) hours as needed.      aspirin 81 MG chewable tablet   Chew 1 tablet (81 mg total) by mouth daily.      carvedilol 6.25 MG tablet   Commonly known as: COREG   Take 6.25 mg by mouth 2 (two) times daily with a meal. Pt takes this medication at 8 am and 8 pm      cilostazol 100 MG tablet   Commonly known as: PLETAL   Take 100 mg by mouth daily.      clopidogrel 75 MG tablet   Commonly known as: PLAVIX   Take 75 mg by mouth daily.      ENDOCET 5-325 MG per tablet   Generic drug: oxyCODONE-acetaminophen   Take 1 tablet by mouth every  6 (six) hours as needed. For pain      lisinopril 10 MG tablet   Commonly known as: PRINIVIL,ZESTRIL   Take 10 mg by mouth daily.      lisinopril 10 MG tablet   Commonly known as: PRINIVIL,ZESTRIL   Take 10 mg by mouth  daily.      multivitamins ther. w/minerals Tabs   Take 1 tablet by mouth daily.      sertraline 100 MG tablet   Commonly known as: ZOLOFT   Take 50 mg by mouth daily.      simvastatin 40 MG tablet   Commonly known as: ZOCOR   Take 40 mg by mouth at bedtime.        Duration of Discharge Encounter: Greater than 30 minutes including physician time.  Jolene Provost PA-C 10/01/2012 10:12 AM

## 2012-10-01 NOTE — Progress Notes (Signed)
Assessed for utilization review 

## 2012-10-02 ENCOUNTER — Other Ambulatory Visit (HOSPITAL_COMMUNITY): Payer: Self-pay | Admitting: Cardiovascular Disease

## 2012-10-02 DIAGNOSIS — I739 Peripheral vascular disease, unspecified: Secondary | ICD-10-CM

## 2012-10-27 ENCOUNTER — Ambulatory Visit (HOSPITAL_COMMUNITY)
Admit: 2012-10-27 | Discharge: 2012-10-27 | Disposition: A | Discharge status: Home / Self Care | Payer: Medicare Other | Attending: Cardiovascular Disease | Admitting: Cardiovascular Disease

## 2012-10-27 DIAGNOSIS — I739 Peripheral vascular disease, unspecified: Secondary | ICD-10-CM | POA: Insufficient documentation

## 2012-10-27 NOTE — Progress Notes (Signed)
Left lower extremity Duplex completed. Anthony Marsh

## 2012-10-31 ENCOUNTER — Encounter (HOSPITAL_COMMUNITY)
Admission: RE | Disposition: A | Discharge status: Home / Self Care | Payer: Self-pay | Source: Ambulatory Visit | Attending: Cardiovascular Disease

## 2012-10-31 ENCOUNTER — Ambulatory Visit (HOSPITAL_COMMUNITY)
Admission: RE | Admit: 2012-10-31 | Discharge: 2012-10-31 | Disposition: A | Discharge status: Home / Self Care | Payer: Medicare Other | Source: Ambulatory Visit | Attending: Cardiovascular Disease | Admitting: Cardiovascular Disease

## 2012-10-31 DIAGNOSIS — I70219 Atherosclerosis of native arteries of extremities with intermittent claudication, unspecified extremity: Secondary | ICD-10-CM

## 2012-10-31 DIAGNOSIS — I739 Peripheral vascular disease, unspecified: Secondary | ICD-10-CM | POA: Insufficient documentation

## 2012-10-31 HISTORY — PX: LOWER EXTREMITY ANGIOGRAM: SHX5508

## 2012-10-31 SURGERY — ANGIOGRAM, LOWER EXTREMITY
Anesthesia: LOCAL | Laterality: Left

## 2012-10-31 MED ORDER — HEPARIN (PORCINE) IN NACL 2-0.9 UNIT/ML-% IJ SOLN
INTRAMUSCULAR | Status: AC
  Filled 2012-10-31: qty 1000

## 2012-10-31 MED ORDER — SODIUM CHLORIDE 0.9 % IV SOLN
INTRAVENOUS | Status: DC
  Administered 2012-10-31: 11:00:00 via INTRAVENOUS

## 2012-10-31 MED ORDER — LIDOCAINE HCL (PF) 1 % IJ SOLN
INTRAMUSCULAR | Status: AC
  Filled 2012-10-31: qty 30

## 2012-10-31 MED ORDER — DIAZEPAM 5 MG PO TABS
ORAL_TABLET | ORAL | Status: AC
  Filled 2012-10-31: qty 1

## 2012-10-31 MED ORDER — SODIUM CHLORIDE 0.9 % IJ SOLN: 3.0000 mL | INTRAMUSCULAR | Status: DC | PRN

## 2012-10-31 MED ORDER — DIAZEPAM 5 MG PO TABS
5.0000 mg | ORAL_TABLET | ORAL | Status: AC
  Administered 2012-10-31: 5 mg via ORAL

## 2012-10-31 MED ORDER — ONDANSETRON HCL 4 MG/2ML IJ SOLN: 4.0000 mg | Freq: Four times a day (QID) | INTRAMUSCULAR | Status: DC | PRN

## 2012-10-31 MED ORDER — SODIUM CHLORIDE 0.9 % IV SOLN: INTRAVENOUS | Status: DC

## 2012-10-31 MED ORDER — ACETAMINOPHEN 325 MG PO TABS: 650.0000 mg | ORAL_TABLET | ORAL | Status: DC | PRN

## 2012-10-31 NOTE — CV Procedure (Signed)
Anthony Marsh is a 79 y.o. male    [id number] LOCATION:  FACILITY: [facility]  PHYSICIAN: Nanetta Batty, M.D. 12-31-1922   DATE OF PROCEDURE:  10/31/2012  DATE OF DISCHARGE:  [facility]  PV Angio    History obtained from chart review. Mr. Kelsay is an 79 year old thin appearing widowed black male male father of one daughter referred to me by Dr. [place] Hilty for peripheral vascular evaluation because of bilateral claudication. He's had a history of right femoropopliteal bypass grafting. I performed initial angiography on him 08/05/2012 and stented his left common iliac artery ostium, demonstrated a patent right femoropopliteal bypass graft with calcific calcification beyond graft insertion, and high-grade mid and distal left SFA calcified stenoses with one vessel runoff via the peroneal. I performed diamondback orbital rotational atherectomy and PTA using a chocolate balloon on December 17 of last year. I used a left antegrade access because of inability to cross the iliac bifurcation. I did use distal protection with a NAV 6 distal protection device. I had an excellent angiographic result although the patient came back with recurrent claudication and Doppler showed an occluded mid left SFA. I had not worked on that part of the vessel. The area that had been intervened on previously appeared widely patent. Presents now for re angiography to determine the etiology of the occlusion.   PROCEDURE DESCRIPTION:    The patient was brought to the second floor  [facility] cath lab in the postabsorptive state. He was  premedicated with Valium 5 mg by mouth. His right groin was prepped and shaved in usual sterile fashion. Xylocaine 1% was used for local anesthesia. A 5 French sheath was inserted into the right common femoral artery using standard Seldinger technique. A 5 French crossover catheter and endhole catheter along with an 035 Versicore  wire were used to  obtain contralateral access for left lower extremity angiography. Visipaque dye was used for the entirety of the case. Retrograde aortic pressure was monitored during the case.   HEMODYNAMICS:    AO SYSTOLIC/AO DIASTOLIC: 169/64    ANGIOGRAPHIC RESULTS:   1. There was obvious fluoroscopic calcification throughout the entirety of the SFA, popliteal, and tibial vessels. There was a retained nab 6 ulcer in the mid left SFA flow beyond. The area of the distal left SFA and above-the-knee popliteal artery which had been previously atherectomized and angioplasty were widely patent.   IMPRESSION:Retained NAV 6 filter in the mid  left SFA obstructing flow. I reviewed this with Dr. Leonette Most fields, vascular surgeon ,who feels that this can be extracted surgically with local incision and patch angioplasty. He will see the patient for evaluation and scheduling of the case.  Runell Gess MD, [facility] 10/31/2012 2:04 PM

## 2012-10-31 NOTE — H&P (Signed)
  H & P will be scanned in.  Pt was reexamined and existing H & P reviewed. No changes found.  Runell Gess, MD [facility] 10/31/2012 1:26 PM
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
## 2012-11-03 ENCOUNTER — Other Ambulatory Visit: Payer: Self-pay

## 2012-11-04 ENCOUNTER — Inpatient Hospital Stay (HOSPITAL_COMMUNITY): Payer: Medicare Other

## 2012-11-04 ENCOUNTER — Inpatient Hospital Stay (HOSPITAL_COMMUNITY): Payer: Medicare Other | Admitting: Critical Care Medicine

## 2012-11-04 ENCOUNTER — Encounter (HOSPITAL_COMMUNITY): Payer: Self-pay | Admitting: Critical Care Medicine

## 2012-11-04 ENCOUNTER — Encounter (HOSPITAL_COMMUNITY): Payer: Self-pay | Admitting: *Deleted

## 2012-11-04 ENCOUNTER — Encounter (HOSPITAL_COMMUNITY)
Admission: RE | Disposition: A | Discharge status: Home / Self Care | Payer: Self-pay | Source: Ambulatory Visit | Attending: Vascular Surgery

## 2012-11-04 ENCOUNTER — Inpatient Hospital Stay (HOSPITAL_COMMUNITY)
Admission: RE | Admit: 2012-11-04 | Discharge: 2012-11-05 | DRG: 253 | Disposition: A | Discharge status: Home / Self Care | Payer: Medicare Other | Source: Ambulatory Visit | Attending: Vascular Surgery | Admitting: Vascular Surgery

## 2012-11-04 DIAGNOSIS — I743 Embolism and thrombosis of arteries of the lower extremities: Secondary | ICD-10-CM

## 2012-11-04 DIAGNOSIS — I10 Essential (primary) hypertension: Secondary | ICD-10-CM | POA: Diagnosis present

## 2012-11-04 DIAGNOSIS — Z79899 Other long term (current) drug therapy: Secondary | ICD-10-CM

## 2012-11-04 DIAGNOSIS — F3289 Other specified depressive episodes: Secondary | ICD-10-CM | POA: Diagnosis present

## 2012-11-04 DIAGNOSIS — Y838 Other surgical procedures as the cause of abnormal reaction of the patient, or of later complication, without mention of misadventure at the time of the procedure: Secondary | ICD-10-CM | POA: Diagnosis not present

## 2012-11-04 DIAGNOSIS — E119 Type 2 diabetes mellitus without complications: Secondary | ICD-10-CM | POA: Diagnosis present

## 2012-11-04 DIAGNOSIS — M129 Arthropathy, unspecified: Secondary | ICD-10-CM | POA: Diagnosis present

## 2012-11-04 DIAGNOSIS — F329 Major depressive disorder, single episode, unspecified: Secondary | ICD-10-CM | POA: Diagnosis present

## 2012-11-04 DIAGNOSIS — IMO0002 Reserved for concepts with insufficient information to code with codable children: Secondary | ICD-10-CM | POA: Diagnosis not present

## 2012-11-04 DIAGNOSIS — I70299 Other atherosclerosis of native arteries of extremities, unspecified extremity: Secondary | ICD-10-CM | POA: Diagnosis present

## 2012-11-04 DIAGNOSIS — I739 Peripheral vascular disease, unspecified: Secondary | ICD-10-CM | POA: Diagnosis present

## 2012-11-04 DIAGNOSIS — T82598A Other mechanical complication of other cardiac and vascular devices and implants, initial encounter: Principal | ICD-10-CM | POA: Diagnosis present

## 2012-11-04 DIAGNOSIS — Y831 Surgical operation with implant of artificial internal device as the cause of abnormal reaction of the patient, or of later complication, without mention of misadventure at the time of the procedure: Secondary | ICD-10-CM | POA: Diagnosis present

## 2012-11-04 HISTORY — PX: FOREIGN BODY REMOVAL: SHX962

## 2012-11-04 HISTORY — PX: ENDARTERECTOMY FEMORAL: SHX5804

## 2012-11-04 HISTORY — PX: PATCH ANGIOPLASTY: SHX6230

## 2012-11-04 HISTORY — DX: Adverse effect of unspecified anesthetic, initial encounter: T41.45XA

## 2012-11-04 LAB — URINALYSIS, ROUTINE W REFLEX MICROSCOPIC
Bilirubin Urine: NEGATIVE
Glucose, UA: NEGATIVE mg/dL
Hgb urine dipstick: NEGATIVE
Specific Gravity, Urine: 1.015 (ref 1.005–1.030)
Urobilinogen, UA: 0.2 mg/dL (ref 0.0–1.0)
pH: 6.5 (ref 5.0–8.0)

## 2012-11-04 LAB — COMPREHENSIVE METABOLIC PANEL
ALT: 13 U/L (ref 0–53)
Alkaline Phosphatase: 75 U/L (ref 39–117)
BUN: 24 mg/dL — ABNORMAL HIGH (ref 6–23)
CO2: 26 mEq/L (ref 19–32)
GFR calc Af Amer: 83 mL/min — ABNORMAL LOW (ref >90)
GFR calc non Af Amer: 72 mL/min — ABNORMAL LOW (ref >90)
Glucose, Bld: 107 mg/dL — ABNORMAL HIGH (ref 70–99)
Potassium: 5.1 mEq/L (ref 3.5–5.1)
Sodium: 139 mEq/L (ref 135–145)
Total Bilirubin: 0.2 mg/dL — ABNORMAL LOW (ref 0.3–1.2)
Total Protein: 7 g/dL (ref 6.0–8.3)

## 2012-11-04 LAB — CBC
HCT: 31.7 % — ABNORMAL LOW (ref 39.0–52.0)
Hemoglobin: 10.8 g/dL — ABNORMAL LOW (ref 13.0–17.0)
MCHC: 34.1 g/dL (ref 30.0–36.0)
RBC: 3.41 MIL/uL — ABNORMAL LOW (ref 4.22–5.81)

## 2012-11-04 LAB — APTT: aPTT: 33 seconds (ref 24–37)

## 2012-11-04 LAB — GLUCOSE, CAPILLARY
Glucose-Capillary: 101 mg/dL — ABNORMAL HIGH (ref 70–99)
Glucose-Capillary: 84 mg/dL (ref 70–99)

## 2012-11-04 LAB — PROTIME-INR: Prothrombin Time: 12.5 seconds (ref 11.6–15.2)

## 2012-11-04 LAB — SURGICAL PCR SCREEN
MRSA, PCR: NEGATIVE
Staphylococcus aureus: NEGATIVE

## 2012-11-04 LAB — ABO/RH: ABO/RH(D): AB POS

## 2012-11-04 SURGERY — ENDARTERECTOMY, FEMORAL
Anesthesia: General | Site: Leg Upper | Laterality: Left | Wound class: Clean

## 2012-11-04 MED ORDER — LACTATED RINGERS IV SOLN
INTRAVENOUS | Status: DC
  Administered 2012-11-04: 13:00:00 via INTRAVENOUS

## 2012-11-04 MED ORDER — PROPOFOL 10 MG/ML IV BOLUS
INTRAVENOUS | Status: DC | PRN
  Administered 2012-11-04: 50 mg via INTRAVENOUS

## 2012-11-04 MED ORDER — SIMVASTATIN 40 MG PO TABS
40.0000 mg | ORAL_TABLET | Freq: Every day | ORAL | Status: DC
  Administered 2012-11-04: 40 mg via ORAL
  Filled 2012-11-04 (×2): qty 1

## 2012-11-04 MED ORDER — SODIUM CHLORIDE 0.9 % IV SOLN
INTRAVENOUS | Status: DC | PRN
  Administered 2012-11-04: 15:00:00 via INTRAVENOUS

## 2012-11-04 MED ORDER — NEOSTIGMINE METHYLSULFATE 1 MG/ML IJ SOLN
INTRAMUSCULAR | Status: DC | PRN
  Administered 2012-11-04: 3 mg via INTRAVENOUS

## 2012-11-04 MED ORDER — MUPIROCIN 2 % EX OINT
TOPICAL_OINTMENT | CUTANEOUS | Status: AC
  Filled 2012-11-04: qty 22

## 2012-11-04 MED ORDER — MUPIROCIN 2 % EX OINT
TOPICAL_OINTMENT | Freq: Once | CUTANEOUS | Status: AC
  Administered 2012-11-04: 1 via NASAL

## 2012-11-04 MED ORDER — ACETAMINOPHEN 650 MG RE SUPP: 325.0000 mg | RECTAL | Status: DC | PRN

## 2012-11-04 MED ORDER — CARVEDILOL 6.25 MG PO TABS
6.2500 mg | ORAL_TABLET | Freq: Two times a day (BID) | ORAL | Status: DC
  Administered 2012-11-05: 6.25 mg via ORAL
  Filled 2012-11-04 (×3): qty 1

## 2012-11-04 MED ORDER — MAGNESIUM SULFATE 40 MG/ML IJ SOLN
2.0000 g | Freq: Once | INTRAMUSCULAR | Status: AC | PRN
  Filled 2012-11-04: qty 50

## 2012-11-04 MED ORDER — DEXTROSE 5 % IV SOLN
1.5000 g | Freq: Two times a day (BID) | INTRAVENOUS | Status: DC
  Administered 2012-11-05: 1.5 g via INTRAVENOUS
  Filled 2012-11-04 (×2): qty 1.5

## 2012-11-04 MED ORDER — HEPARIN SODIUM (PORCINE) 1000 UNIT/ML IJ SOLN
INTRAMUSCULAR | Status: DC | PRN
  Administered 2012-11-04: 7000 [IU] via INTRAVENOUS

## 2012-11-04 MED ORDER — PHENOL 1.4 % MT LIQD: 1.0000 | OROMUCOSAL | Status: DC | PRN

## 2012-11-04 MED ORDER — ONDANSETRON HCL 4 MG/2ML IJ SOLN
INTRAMUSCULAR | Status: DC | PRN
  Administered 2012-11-04: 4 mg via INTRAVENOUS

## 2012-11-04 MED ORDER — SODIUM CHLORIDE 0.9 % IV SOLN: 500.0000 mL | Freq: Once | INTRAVENOUS | Status: AC | PRN

## 2012-11-04 MED ORDER — PROTAMINE SULFATE 10 MG/ML IV SOLN
INTRAVENOUS | Status: DC | PRN
  Administered 2012-11-04: 10 mg via INTRAVENOUS
  Administered 2012-11-04 (×2): 20 mg via INTRAVENOUS

## 2012-11-04 MED ORDER — LIDOCAINE HCL (PF) 1 % IJ SOLN
INTRAMUSCULAR | Status: AC
  Filled 2012-11-04: qty 30

## 2012-11-04 MED ORDER — PANTOPRAZOLE SODIUM 40 MG PO TBEC: 40.0000 mg | DELAYED_RELEASE_TABLET | Freq: Every day | ORAL | Status: DC

## 2012-11-04 MED ORDER — LISINOPRIL 10 MG PO TABS
10.0000 mg | ORAL_TABLET | Freq: Every day | ORAL | Status: DC
  Administered 2012-11-05: 10 mg via ORAL
  Filled 2012-11-04: qty 1

## 2012-11-04 MED ORDER — PROPOFOL INFUSION 10 MG/ML OPTIME
INTRAVENOUS | Status: DC | PRN
  Administered 2012-11-04: 140 ug/kg/min via INTRAVENOUS

## 2012-11-04 MED ORDER — ALUM & MAG HYDROXIDE-SIMETH 200-200-20 MG/5ML PO SUSP: 15.0000 mL | ORAL | Status: DC | PRN

## 2012-11-04 MED ORDER — FENTANYL CITRATE 0.05 MG/ML IJ SOLN
INTRAMUSCULAR | Status: DC | PRN
  Administered 2012-11-04: 50 ug via INTRAVENOUS
  Administered 2012-11-04 (×2): 25 ug via INTRAVENOUS

## 2012-11-04 MED ORDER — PHENYLEPHRINE HCL 10 MG/ML IJ SOLN
10.0000 mg | INTRAVENOUS | Status: DC | PRN
  Administered 2012-11-04: 25 ug/min via INTRAVENOUS

## 2012-11-04 MED ORDER — DOPAMINE-DEXTROSE 3.2-5 MG/ML-% IV SOLN: 3.0000 ug/kg/min | INTRAVENOUS | Status: DC

## 2012-11-04 MED ORDER — SODIUM CHLORIDE 0.9 % IR SOLN
Status: DC | PRN
  Administered 2012-11-04: 13:00:00

## 2012-11-04 MED ORDER — SERTRALINE HCL 50 MG PO TABS
50.0000 mg | ORAL_TABLET | Freq: Every day | ORAL | Status: DC
Start: 2012-11-05 — End: 2012-11-05
  Administered 2012-11-05: 50 mg via ORAL
  Filled 2012-11-04: qty 1

## 2012-11-04 MED ORDER — ROCURONIUM BROMIDE 100 MG/10ML IV SOLN
INTRAVENOUS | Status: DC | PRN
  Administered 2012-11-04: 40 mg via INTRAVENOUS

## 2012-11-04 MED ORDER — SODIUM CHLORIDE 0.9 % IV SOLN
INTRAVENOUS | Status: DC
  Administered 2012-11-04 – 2012-11-05 (×2): via INTRAVENOUS

## 2012-11-04 MED ORDER — CILOSTAZOL 100 MG PO TABS
100.0000 mg | ORAL_TABLET | Freq: Two times a day (BID) | ORAL | Status: DC
  Administered 2012-11-05: 100 mg via ORAL
  Filled 2012-11-04 (×2): qty 1

## 2012-11-04 MED ORDER — HYDRALAZINE HCL 20 MG/ML IJ SOLN: 10.0000 mg | INTRAMUSCULAR | Status: DC | PRN

## 2012-11-04 MED ORDER — GUAIFENESIN-DM 100-10 MG/5ML PO SYRP: 15.0000 mL | ORAL_SOLUTION | ORAL | Status: DC | PRN

## 2012-11-04 MED ORDER — MORPHINE SULFATE 2 MG/ML IJ SOLN
2.0000 mg | INTRAMUSCULAR | Status: DC | PRN
  Administered 2012-11-04 – 2012-11-05 (×3): 2 mg via INTRAVENOUS
  Filled 2012-11-04 (×3): qty 1

## 2012-11-04 MED ORDER — POTASSIUM CHLORIDE CRYS ER 20 MEQ PO TBCR: 20.0000 meq | EXTENDED_RELEASE_TABLET | Freq: Once | ORAL | Status: AC | PRN

## 2012-11-04 MED ORDER — DEXTROSE 5 % IV SOLN
1.5000 g | INTRAVENOUS | Status: AC
  Administered 2012-11-04: 1.5 g via INTRAVENOUS
  Filled 2012-11-04: qty 1.5

## 2012-11-04 MED ORDER — ACETAMINOPHEN 325 MG PO TABS: 325.0000 mg | ORAL_TABLET | ORAL | Status: DC | PRN

## 2012-11-04 MED ORDER — METOPROLOL TARTRATE 1 MG/ML IV SOLN: 2.0000 mg | INTRAVENOUS | Status: DC | PRN

## 2012-11-04 MED ORDER — EPHEDRINE SULFATE 50 MG/ML IJ SOLN
INTRAMUSCULAR | Status: DC | PRN
  Administered 2012-11-04: 15 mg via INTRAVENOUS

## 2012-11-04 MED ORDER — LABETALOL HCL 5 MG/ML IV SOLN: 10.0000 mg | INTRAVENOUS | Status: DC | PRN

## 2012-11-04 MED ORDER — GLYCOPYRROLATE 0.2 MG/ML IJ SOLN
INTRAMUSCULAR | Status: DC | PRN
  Administered 2012-11-04: 0.1 mg via INTRAVENOUS
  Administered 2012-11-04: .4 mg via INTRAVENOUS
  Administered 2012-11-04: .1 mg via INTRAVENOUS

## 2012-11-04 MED ORDER — OXYCODONE HCL 5 MG PO TABS: 5.0000 mg | ORAL_TABLET | ORAL | Status: DC | PRN

## 2012-11-04 MED ORDER — LIDOCAINE HCL (PF) 1 % IJ SOLN
INTRAMUSCULAR | Status: DC | PRN
  Administered 2012-11-04: 19 mL

## 2012-11-04 MED ORDER — 0.9 % SODIUM CHLORIDE (POUR BTL) OPTIME
TOPICAL | Status: DC | PRN
  Administered 2012-11-04 (×2): 1000 mL

## 2012-11-04 MED ORDER — DOCUSATE SODIUM 100 MG PO CAPS
100.0000 mg | ORAL_CAPSULE | Freq: Every day | ORAL | Status: DC
  Administered 2012-11-05: 100 mg via ORAL
  Filled 2012-11-04: qty 1

## 2012-11-04 MED ORDER — ONDANSETRON HCL 4 MG/2ML IJ SOLN: 4.0000 mg | Freq: Four times a day (QID) | INTRAMUSCULAR | Status: DC | PRN

## 2012-11-04 MED ORDER — CLOPIDOGREL BISULFATE 75 MG PO TABS
75.0000 mg | ORAL_TABLET | Freq: Every day | ORAL | Status: DC
  Administered 2012-11-05: 75 mg via ORAL
  Filled 2012-11-04 (×2): qty 1

## 2012-11-04 MED ORDER — SODIUM CHLORIDE 0.9 % IV SOLN: INTRAVENOUS | Status: DC

## 2012-11-04 SURGICAL SUPPLY — 57 items
BANDAGE ESMARK 6X9 LF (GAUZE/BANDAGES/DRESSINGS) IMPLANT
BNDG ESMARK 6X9 LF (GAUZE/BANDAGES/DRESSINGS)
CANISTER SUCTION 2500CC (MISCELLANEOUS) ×2 IMPLANT
CANNULA VESSEL W/WING WO/VALVE (CANNULA) ×2 IMPLANT
CLIP TI MEDIUM 24 (CLIP) ×2 IMPLANT
CLIP TI WIDE RED SMALL 24 (CLIP) ×2 IMPLANT
CLOTH BEACON ORANGE TIMEOUT ST (SAFETY) ×2 IMPLANT
CONT SPECI 4OZ STER CLIK (MISCELLANEOUS) ×2 IMPLANT
COVER SURGICAL LIGHT HANDLE (MISCELLANEOUS) ×2 IMPLANT
CUFF TOURNIQUET SINGLE 24IN (TOURNIQUET CUFF) IMPLANT
CUFF TOURNIQUET SINGLE 34IN LL (TOURNIQUET CUFF) IMPLANT
CUFF TOURNIQUET SINGLE 44IN (TOURNIQUET CUFF) IMPLANT
DERMABOND ADVANCED (GAUZE/BANDAGES/DRESSINGS) ×1
DERMABOND ADVANCED .7 DNX12 (GAUZE/BANDAGES/DRESSINGS) ×1 IMPLANT
DRAIN SNY WOU (WOUND CARE) IMPLANT
DRAPE WARM FLUID 44X44 (DRAPE) ×2 IMPLANT
DRSG COVADERM 4X8 (GAUZE/BANDAGES/DRESSINGS) IMPLANT
ELECT REM PT RETURN 9FT ADLT (ELECTROSURGICAL) ×2
ELECTRODE REM PT RTRN 9FT ADLT (ELECTROSURGICAL) ×1 IMPLANT
EVACUATOR SILICONE 100CC (DRAIN) IMPLANT
GLOVE BIO SURGEON STRL SZ 6.5 (GLOVE) ×4 IMPLANT
GLOVE BIO SURGEON STRL SZ7.5 (GLOVE) ×2 IMPLANT
GLOVE BIOGEL PI IND STRL 7.0 (GLOVE) ×1 IMPLANT
GLOVE BIOGEL PI IND STRL 7.5 (GLOVE) ×1 IMPLANT
GLOVE BIOGEL PI IND STRL 8 (GLOVE) ×1 IMPLANT
GLOVE BIOGEL PI INDICATOR 7.0 (GLOVE) ×1
GLOVE BIOGEL PI INDICATOR 7.5 (GLOVE) ×1
GLOVE BIOGEL PI INDICATOR 8 (GLOVE) ×1
GLOVE SURG SS PI 7.5 STRL IVOR (GLOVE) ×2 IMPLANT
GLOVE SURG SS PI 8.0 STRL IVOR (GLOVE) ×2 IMPLANT
GOWN PREVENTION PLUS XLARGE (GOWN DISPOSABLE) ×6 IMPLANT
GOWN STRL NON-REIN LRG LVL3 (GOWN DISPOSABLE) ×2 IMPLANT
KIT BASIN OR (CUSTOM PROCEDURE TRAY) ×2 IMPLANT
KIT ROOM TURNOVER OR (KITS) ×2 IMPLANT
MARKER SKIN DUAL TIP RULER LAB (MISCELLANEOUS) ×2 IMPLANT
NEEDLE HYPO 25GX1X1/2 BEV (NEEDLE) ×2 IMPLANT
NS IRRIG 1000ML POUR BTL (IV SOLUTION) ×4 IMPLANT
PACK PERIPHERAL VASCULAR (CUSTOM PROCEDURE TRAY) ×2 IMPLANT
PAD ARMBOARD 7.5X6 YLW CONV (MISCELLANEOUS) ×4 IMPLANT
PADDING CAST COTTON 6X4 STRL (CAST SUPPLIES) IMPLANT
PATCH VASCULAR VASCU GUARD 1X6 (Vascular Products) ×2 IMPLANT
SPONGE INTESTINAL PEANUT (DISPOSABLE) ×2 IMPLANT
SPONGE SURGIFOAM ABS GEL 100 (HEMOSTASIS) IMPLANT
STAPLER VISISTAT 35W (STAPLE) IMPLANT
SUT ETHILON 3 0 PS 1 (SUTURE) IMPLANT
SUT PROLENE 5 0 C 1 24 (SUTURE) ×2 IMPLANT
SUT PROLENE 6 0 CC (SUTURE) ×4 IMPLANT
SUT VIC AB 2-0 CTX 36 (SUTURE) ×2 IMPLANT
SUT VIC AB 3-0 SH 27 (SUTURE) ×1
SUT VIC AB 3-0 SH 27X BRD (SUTURE) ×1 IMPLANT
SUT VICRYL 4-0 PS2 18IN ABS (SUTURE) ×2 IMPLANT
SYR CONTROL 10ML LL (SYRINGE) ×2 IMPLANT
TOWEL OR 17X24 6PK STRL BLUE (TOWEL DISPOSABLE) ×4 IMPLANT
TOWEL OR 17X26 10 PK STRL BLUE (TOWEL DISPOSABLE) ×4 IMPLANT
TRAY FOLEY CATH 14FRSI W/METER (CATHETERS) ×2 IMPLANT
UNDERPAD 30X30 INCONTINENT (UNDERPADS AND DIAPERS) ×2 IMPLANT
WATER STERILE IRR 1000ML POUR (IV SOLUTION) ×2 IMPLANT

## 2012-11-04 NOTE — Anesthesia Postprocedure Evaluation (Signed)
  Anesthesia Post-op Note  Patient: Anthony Marsh  Procedure(s) Performed: Procedure(s) (LRB) with comments: ENDARTERECTOMY FEMORAL (Left) - Left Superficial Femoral Artery Endartectomy REMOVAL FOREIGN BODY EXTREMITY (Left) - Removal Foreign Body from Left Superficial Femoral Artery PATCH ANGIOPLASTY (Left) - Patch Angioplast of Left Superficial Femoral Artery using Vascu-Guard Peripheral Vascular Patch  Patient Location: PACU  Anesthesia Type:General  Level of Consciousness: awake  Airway and Oxygen Therapy: Patient Spontanous Breathing and Patient connected to nasal cannula oxygen  Post-op Pain: none  Post-op Assessment: Post-op Vital signs reviewed, Patient's Cardiovascular Status Stable, Respiratory Function Stable, Patent Airway and No signs of Nausea or vomiting  Post-op Vital Signs: Reviewed and stable  Complications: No apparent anesthesia complications

## 2012-11-04 NOTE — Preoperative (Signed)
Beta Blockers   Reason not to administer Beta Blockers:Not Applicable, pt took 1/21

## 2012-11-04 NOTE — Progress Notes (Signed)
Notified Dr. Krista Blue pt. presented a note from his daughter stating "no anesthesia. He has bad reaction. Local only." Pt. Stating the reaction happened 1 yr. Ago,which made him angry at others and made him "bananas." stated this happened during a producure w/ Dr. Allyson Sabal.

## 2012-11-04 NOTE — Progress Notes (Signed)
Pages md on calll, dr Myra Gianotti returned call, notified dr Myra Gianotti re hematoma slightly grown in size since dr fields seen last at 1600, new orders to hold pressure on it x69min and will come assess when done in OR, Beryle Quant

## 2012-11-04 NOTE — Transfer of Care (Signed)
Immediate Anesthesia Transfer of Care Note  Patient: Anthony Marsh  Procedure(s) Performed: Procedure(s) (LRB) with comments: ENDARTERECTOMY FEMORAL (Left) - Left Superficial Femoral Artery Endartectomy REMOVAL FOREIGN BODY EXTREMITY (Left) - Removal Foreign Body from Left Superficial Femoral Artery PATCH ANGIOPLASTY (Left) - Patch Angioplast of Left Superficial Femoral Artery using Vascu-Guard Peripheral Vascular Patch  Patient Location: PACU  Anesthesia Type:General  Level of Consciousness: sedated  Airway & Oxygen Therapy: Patient Spontanous Breathing and Patient connected to face mask oxygen  Post-op Assessment: Report given to PACU RN, Post -op Vital signs reviewed and stable and Patient moving all extremities X 4  Post vital signs: Reviewed and stable  Complications: No apparent anesthesia complications

## 2012-11-04 NOTE — Progress Notes (Signed)
I was called about a left thigh hematoma that had expanded since Dr. Darrick Penna had evaluated it.  I was tied up in the OR and instructed the nurse to hold gentle pressure for 20 minutes.  When I came by to evaluate the area, the nurse and patient said that it was significantly better.  The area of concern had been marked with an ink pen.  It was soft and had not increased in size.  I recommended serial evaluations.  There is no need at this point to explore the wound.  Durene Cal

## 2012-11-04 NOTE — Interval H&P Note (Signed)
History and Physical Interval Note:  11/04/2012 12:18 PM  Anthony Marsh  has presented today for surgery, with the diagnosis of Foreign Body Peripheral Vascular Disease  The various methods of treatment have been discussed with the patient and family. After consideration of risks, benefits and other options for treatment, the patient has consented to  Procedure(s) (LRB) with comments: ENDARTERECTOMY FEMORAL (Left) - Left Superficial Femoral Artery Endartectomy and Removal of Foreign Body as a surgical intervention .  The patient's history has been reviewed, patient examined, no change in status, stable for surgery.  I have reviewed the patient's chart and labs.  Questions were answered to the patient's satisfaction.     FIELDS,CHARLES E
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
## 2012-11-04 NOTE — Anesthesia Procedure Notes (Addendum)
Procedure Name: MAC Date/Time: 11/04/2012 1:00 PM Performed by: Elon Alas Pre-anesthesia Checklist: Patient identified, Timeout performed, Emergency Drugs available, Suction available and Patient being monitored Patient Re-evaluated:Patient Re-evaluated prior to inductionOxygen Delivery Method: Simple face mask Placement Confirmation: positive ETCO2 and breath sounds checked- equal and bilateral Dental Injury: Teeth and Oropharynx as per pre-operative assessment    Date/Time: 11/04/2012 2:11 PM Performed by: Yvonne Kendall S Intubation Type: IV induction Ventilation: Mask ventilation without difficulty Laryngoscope Size: Miller and 2 Grade View: Grade II Tube size: 7.5 mm Number of attempts: 1 Placement Confirmation: ETT inserted through vocal cords under direct vision,  breath sounds checked- equal and bilateral,  positive ETCO2 and CO2 detector Secured at: 23 cm Tube secured with: Tape Dental Injury: Teeth and Oropharynx as per pre-operative assessment

## 2012-11-04 NOTE — Progress Notes (Signed)
Called to see pt for hematoma left thigh,  3 cm nodule at lower aspect of incision non pulsatile non expanding. Overall soft.  Will continue to observe for now.  Fabienne Bruns, MD [facility] [place] Office: [phone] Pager: [phone]

## 2012-11-04 NOTE — Op Note (Signed)
Procedure: #1 removal of embolic protection device #2 left superficial femoral artery endarterectomy with patch angioplasty  Preoperative diagnosis: #1 retained embolic protection device # 2 claudication  Postoperative diagnosis: Same  Indications: Patient is an 79 year old male who is status post a popliteal artery atherectomy several weeks ago by Dr. Allyson Sabal.  Post procedure he was noted to have a occlusion of his left superficial femoral artery on duplex exam. Arteriogram showed a retained embolic protection device in the mid left superficial femoral artery.  Specimens: #1 embolic protection device #2 plaque left superficial femoral artery  Operative details:  After obtaining informed consent, the patient was taken to the operating room. The patient was placed in supine position on the operating room table. After adequate sedation, the patient's entire left lower extremity was prepped and draped in the usual sterile fashion. Local anesthesia was infiltrated on the medial aspect of the left leg over the area of the superficial femoral artery. Fluoroscopy was used to identify the precise location of the retained foreign body. A longitudinal incision was made over the left superficial femoral artery. This was carried to the subcutaneous tissues down to level of the fascia. The sartorius muscle was identified and reflected posteriorly. The left superficial femoral artery was dissected free circumferentially and vessel loops were placed around this. At this point the patient began to have some coughing gagging and vomiting type reflux. It was decided to place an endotracheal tube for airway protection. The artery was heavily calcified. The patient was given 7000 units of intravenous heparin. After 2 minutes of circulation time, the superficial femoral artery was controlled proximally and distally with peripheral DeBakey clamps. Several small side branches were controlled with clips. A longitudinal opening was  made in the superficial femoral artery and the embolic protection device could be identified. This was removed under direct vision. This was passed off the table as a specimen. The artery was heavily calcified with thickened atherosclerotic plaque. It would not be safe to close the artery without endarterectomy. Therefore a endarterectomy was performed over several centimeters of the left superficial femoral artery and a reasonable proximal and distal endpoint was obtained. The plaque was sent to pathology as a specimen. A bovine pericardial patch was then brought up in the operative field and sewn on as a patch angioplasty using a running 6-0 Prolene suture. Just prior to completion of the anastomosis this was forebled backbled and thoroughly flushed. The anastomosis was secured clamps released and there was pulsatile flow in the superficial femoral artery immediately. The patient had posterior tibial Doppler flow. It should be noted that the patient only has 1 vessel peroneal runoff the primarily drains in the posterior tibial circulation. Hemostasis was obtained with the assistance protamine and direct pressure. The subcutaneous tissues were reapproximated using a running 3-0 Vicryl suture. The skin was closed with 4 Vicryl subcuticular stitch. The patient tolerated the procedure well and there were no complications. Instrument sponge and needle counts were correct at the end of the case. The patient was taken to the recovery room in stable condition.  Fabienne Bruns, MD [facility] [place] Office: [phone] Pager: [phone]

## 2012-11-04 NOTE — Anesthesia Preprocedure Evaluation (Addendum)
Anesthesia Evaluation  Patient identified by MRN, date of birth, ID band Patient awake    Reviewed: Allergy & Precautions, H&P , NPO status , Patient's Chart, lab work & pertinent test results, reviewed documented beta blocker date and time   Airway Mallampati: I TM Distance: >3 FB Neck ROM: Full    Dental  (+) Dental Advisory Given, Poor Dentition and Chipped   Pulmonary COPD   Pulmonary exam normal       Cardiovascular hypertension, Pt. on home beta blockers + Peripheral Vascular Disease + dysrhythmias     Neuro/Psych PSYCHIATRIC DISORDERS Depression negative neurological ROS     GI/Hepatic negative GI ROS, Neg liver ROS,   Endo/Other  diabetes  Renal/GU      Musculoskeletal   Abdominal   Peds  Hematology   Anesthesia Other Findings   Reproductive/Obstetrics                        Anesthesia Physical Anesthesia Plan  ASA: III  Anesthesia Plan: General and MAC   Post-op Pain Management:    Induction: Intravenous  Airway Management Planned: Oral ETT and Simple Face Mask  Additional Equipment:   Intra-op Plan:   Post-operative Plan: Extubation in OR  Informed Consent: I have reviewed the patients History and Physical, chart, labs and discussed the procedure including the risks, benefits and alternatives for the proposed anesthesia with the patient or authorized representative who has indicated his/her understanding and acceptance.     Plan Discussed with: Anesthesiologist, Surgeon and CRNA  Anesthesia Plan Comments:        Anesthesia Quick Evaluation

## 2012-11-05 DIAGNOSIS — Z48812 Encounter for surgical aftercare following surgery on the circulatory system: Secondary | ICD-10-CM

## 2012-11-05 LAB — CBC
HCT: 25.9 % — ABNORMAL LOW (ref 39.0–52.0)
MCV: 93.5 fL (ref 78.0–100.0)
RBC: 2.77 MIL/uL — ABNORMAL LOW (ref 4.22–5.81)
WBC: 9.3 10*3/uL (ref 4.0–10.5)

## 2012-11-05 LAB — BASIC METABOLIC PANEL
BUN: 17 mg/dL (ref 6–23)
CO2: 27 mEq/L (ref 19–32)
Chloride: 104 mEq/L (ref 96–112)
Creatinine, Ser: 0.94 mg/dL (ref 0.50–1.35)

## 2012-11-05 MED ORDER — OXYCODONE HCL 5 MG PO TABS: 5.0000 mg | ORAL_TABLET | Freq: Four times a day (QID) | ORAL | Status: DC | PRN

## 2012-11-05 NOTE — Progress Notes (Addendum)
VASCULAR LAB PRELIMINARY  ARTERIAL  ABI completed:    RIGHT    LEFT    PRESSURE WAVEFORM  PRESSURE WAVEFORM  BRACHIAL 124 Triphasic BRACHIAL 116 Triphasic  DP  absent DP  absent  AT   AT    PT 62 Monophasic  PT 91 Monophasic  PER   PER 102 Monophasic   GREAT TOE  NA GREAT TOE  NA    RIGHT LEFT  ABI 0.5 0.82     Annajulia Lewing, RVT 11/05/2012, 9:48 AM

## 2012-11-05 NOTE — Progress Notes (Signed)
Utilization review completed.  
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
## 2012-11-05 NOTE — Progress Notes (Signed)
Pt discharged to home via w/c with belongings. Discussed and explained discharged instructions, f/u appt and prescriptions given. Signed up pt for mychart. Beryle Quant

## 2012-11-05 NOTE — Progress Notes (Addendum)
[facility] Progress Note  11/05/2012 7:24 AM POD 1  Subjective:  Feeling much better.  Hematoma in Left thigh is much improved after pressure held for 20 minutes.  VSS Afebrile 99% RA  Filed Vitals:   11/05/12 0400  BP: 140/51  Pulse: 63  Temp: 97.9 F (36.6 C)  Resp: 13    Physical Exam: Incisions:  C/d/i Extremities:  + left peroneal and PT doppler signal  CBC    Component Value Date/Time   WBC 9.3 11/05/2012 0501   RBC 2.77* 11/05/2012 0501   HGB 8.6* 11/05/2012 0501   HCT 25.9* 11/05/2012 0501   PLT 212 11/05/2012 0501   MCV 93.5 11/05/2012 0501   MCH 31.0 11/05/2012 0501   MCHC 33.2 11/05/2012 0501   RDW 12.8 11/05/2012 0501   LYMPHSABS 2.0 08/29/2011 0541   MONOABS 1.4* 08/29/2011 0541   EOSABS 0.2 08/29/2011 0541   BASOSABS 0.0 08/29/2011 0541    BMET    Component Value Date/Time   NA 138 11/05/2012 0501   K 4.2 11/05/2012 0501   CL 104 11/05/2012 0501   CO2 27 11/05/2012 0501   GLUCOSE 107* 11/05/2012 0501   BUN 17 11/05/2012 0501   CREATININE 0.94 11/05/2012 0501   CALCIUM 8.6 11/05/2012 0501   GFRNONAA 73* 11/05/2012 0501   GFRAA 85* 11/05/2012 0501    INR    Component Value Date/Time   INR 0.94 11/04/2012 1121     Intake/Output Summary (Last 24 hours) at 11/05/12 0724 Last data filed at 11/05/12 0600  Gross per 24 hour  Intake   2745 ml  Output    900 ml  Net   1845 ml     Assessment/Plan:  79 y.o. male is s/p:  #1 removal of embolic protection device #2 left superficial femoral artery endarterectomy with patch angioplasty   POD 1  -pt doing well this am. -pt needs to void. -since pt had hematoma last pm, ? Start Plavix back today or hold one more day.  Will d/w MD.   Doreatha Massed, PA-C [facility] [phone] 11/05/2012 7:24 AM  Addendum  I have independently interviewed and examined the patient, and I agree with the physician assistant's findings.  Minimal small hematoma at L SFA exposure  incision.  Left foot warm.  H/H drop consistent with intraoperative blood loss and hemodilution.  I recommended observation for one more day, but patient says he's asx (no tachycardia or fatigue) and wants to go home.  I have given the patient strict instructions for follow up if he develops any sx from his anemia.  The patient will follow up with Dr. Darrick Penna within the next two weeks.  Leonides Sake, MD [facility] [place] Office: [phone] Pager: [phone]  11/05/2012, 8:39 AM

## 2012-11-06 ENCOUNTER — Telehealth: Payer: Self-pay | Admitting: Vascular Surgery

## 2012-11-06 NOTE — Telephone Encounter (Addendum)
Message copied by Rosalyn Charters on Thu Nov 06, 2012 10:05 AM ------      Message from: [place], [place] K      Created: Thu Nov 06, 2012  8:06 AM      Regarding: schedule                   ----- Message -----         From: Dara Lords, PA         Sent: 11/05/2012   7:31 AM           To: Sharee Pimple, CMA            S/p #1 removal of embolic protection device #2 left superficial femoral artery endarterectomy with patch angioplasty      11/04/12 by CEF.  F/u with him in 2 weeks.            Thanks,      Samantha  notified pt. of fu appt. on 11-20-12 at 9 am also mailed appt. info

## 2012-11-07 ENCOUNTER — Encounter (HOSPITAL_COMMUNITY): Payer: Self-pay | Admitting: Vascular Surgery

## 2012-11-10 ENCOUNTER — Encounter: Payer: Self-pay | Admitting: Surgery

## 2012-11-10 ENCOUNTER — Ambulatory Visit (INDEPENDENT_AMBULATORY_CARE_PROVIDER_SITE_OTHER): Payer: Medicare Other | Admitting: Surgery

## 2012-11-10 ENCOUNTER — Other Ambulatory Visit: Payer: Self-pay | Admitting: Surgery

## 2012-11-10 ENCOUNTER — Encounter (INDEPENDENT_AMBULATORY_CARE_PROVIDER_SITE_OTHER): Payer: Medicare Other | Admitting: *Deleted

## 2012-11-10 VITALS — BP 134/62 | HR 68 | Temp 97.0°F | Resp 16 | Ht 68.0 in | Wt 161.0 lb

## 2012-11-10 DIAGNOSIS — T148XXA Other injury of unspecified body region, initial encounter: Secondary | ICD-10-CM

## 2012-11-10 DIAGNOSIS — R5381 Other malaise: Secondary | ICD-10-CM

## 2012-11-10 DIAGNOSIS — R109 Unspecified abdominal pain: Secondary | ICD-10-CM

## 2012-11-10 DIAGNOSIS — R1030 Lower abdominal pain, unspecified: Secondary | ICD-10-CM | POA: Insufficient documentation

## 2012-11-10 DIAGNOSIS — M79609 Pain in unspecified limb: Secondary | ICD-10-CM

## 2012-11-10 DIAGNOSIS — M79652 Pain in left thigh: Secondary | ICD-10-CM | POA: Insufficient documentation

## 2012-11-10 DIAGNOSIS — I739 Peripheral vascular disease, unspecified: Secondary | ICD-10-CM

## 2012-11-10 DIAGNOSIS — R5383 Other fatigue: Secondary | ICD-10-CM

## 2012-11-10 DIAGNOSIS — Z48812 Encounter for surgical aftercare following surgery on the circulatory system: Secondary | ICD-10-CM

## 2012-11-10 NOTE — Addendum Note (Signed)
Addended by: Melodye Ped C on: 11/10/2012 03:06 PM   Modules accepted: Orders

## 2012-11-10 NOTE — Progress Notes (Signed)
The patient comes in today for followup. On 11/04/2012 he underwent removal of an embolic protection device as well as a left superficial femoral endarterectomy with patch angioplasty. This was done in the setting of a retained embolic protection device as well as claudication. This procedure was done by Dr. Darrick Penna. Postoperatively the patient did have a hematoma in his thigh but there was no evidence of this was expanding. He was ultimately discharged. Over the weekend he felt that it had gotten worse and wanted it to be evaluated. He also complains of  On examination the thigh looks very similar to the way it is in the hospital. There are old markings from the extent of the hematoma with. There is currently no extension beyond these lines. The patient foot is warm and well perfused. There is an ecchymosis in the thigh. It is relatively soft. There is bruising but no evidence of infection. There is no drainage from the incision.  I had this area ultrasound today. There are extensive collaterals around the hematoma. There is no evidence that this is a pseudoaneurysm.  Because of the patient's fatigue and sending him for a hemoglobin and hematocrit today. I have scheduled him to come back and be reevaluated by Dr. Darrick Penna on Thursday

## 2012-11-12 ENCOUNTER — Encounter: Payer: Self-pay | Admitting: Vascular Surgery

## 2012-11-13 ENCOUNTER — Encounter: Payer: Self-pay | Admitting: Vascular Surgery

## 2012-11-13 ENCOUNTER — Ambulatory Visit (INDEPENDENT_AMBULATORY_CARE_PROVIDER_SITE_OTHER): Payer: Medicare Other | Admitting: Vascular Surgery

## 2012-11-13 VITALS — BP 127/55 | HR 64 | Temp 98.3°F | Ht 68.0 in | Wt 160.8 lb

## 2012-11-13 DIAGNOSIS — M79609 Pain in unspecified limb: Secondary | ICD-10-CM

## 2012-11-13 DIAGNOSIS — M79606 Pain in leg, unspecified: Secondary | ICD-10-CM | POA: Insufficient documentation

## 2012-11-13 DIAGNOSIS — R109 Unspecified abdominal pain: Secondary | ICD-10-CM | POA: Insufficient documentation

## 2012-11-13 DIAGNOSIS — I739 Peripheral vascular disease, unspecified: Secondary | ICD-10-CM

## 2012-11-13 NOTE — Progress Notes (Signed)
Patient is an 79 year old male who recently underwent left superficial femoral artery endarterectomy and removal of a retained embolic protection device from Dr. Hazle Coca procedure.  He had a bovine pericardial patch angioplasty of the left superficial femoral artery. He developed a hematoma postoperatively. He returns today for further followup. He still complains of swelling in the left lower extremity. The hematoma has not changed in size since seen by my partner Dr. Myra Gianotti earlier this week. It is not expanding.  Physical exam: Filed Vitals:   11/13/12 1009  BP: 127/55  Pulse: 64  Temp: 98.3 F (36.8 C)  TempSrc: Oral  Height: 5\' 8"  (1.727 m)  Weight: 160 lb 12.8 oz (72.938 kg)  SpO2: 98%   Left lower extremity: 7 x 7 cm hematoma in the medial aspect of the left upper thigh, incision is intact, no surrounding erythema, nonpulsatile brisk peroneal and posterior tibial Doppler signal  Assessment: Postop hematoma left leg no evidence of infection  Plan: Discussed with patient today the possibility of operative drainage of the hematoma. This would be for symptomatic relief. At this point he desires conservative management with observation. He'll return to followup in 2 weeks  Fabienne Bruns, MD [facility] [place] Office: [phone] Pager: [phone]

## 2012-11-20 ENCOUNTER — Other Ambulatory Visit (HOSPITAL_COMMUNITY): Payer: Self-pay | Admitting: Cardiovascular Disease

## 2012-11-20 ENCOUNTER — Ambulatory Visit: Payer: Medicare Other | Admitting: Vascular Surgery

## 2012-11-20 DIAGNOSIS — I739 Peripheral vascular disease, unspecified: Secondary | ICD-10-CM

## 2012-11-27 ENCOUNTER — Ambulatory Visit: Payer: Self-pay | Admitting: Vascular Surgery

## 2012-12-03 ENCOUNTER — Encounter: Payer: Self-pay | Admitting: Vascular Surgery

## 2012-12-04 ENCOUNTER — Encounter: Payer: Self-pay | Admitting: Vascular Surgery

## 2012-12-04 ENCOUNTER — Ambulatory Visit (INDEPENDENT_AMBULATORY_CARE_PROVIDER_SITE_OTHER): Payer: Medicare Other | Admitting: Vascular Surgery

## 2012-12-04 VITALS — BP 148/60 | HR 67 | Ht 68.0 in | Wt 155.6 lb

## 2012-12-04 DIAGNOSIS — T148XXA Other injury of unspecified body region, initial encounter: Secondary | ICD-10-CM

## 2012-12-04 DIAGNOSIS — I739 Peripheral vascular disease, unspecified: Secondary | ICD-10-CM

## 2012-12-04 NOTE — Progress Notes (Signed)
Patient returns for followup today. He is an 79 year old male. He recently had endarterectomy of his left superficial femoral artery and removal of a retained embolic protection device from a procedure by Dr. Allyson Sabal. He had developed a hematoma and presents today for further evaluation of this. He reports that he still has some claudication type symptoms in his left lower extremity. However, he feels that he is overall improved. He denies any rest pain.  Physical exam:  Filed Vitals:   12/04/12 1111  BP: 148/60  Pulse: 67  Height: 5\' 8"  (1.727 m)  Weight: 155 lb 9.6 oz (70.58 kg)  SpO2: 100%   Left lower extremity: Incision is completely healed hematoma has completely resolved left foot is warm pink well-perfused with Doppler signals  Assessment: Healed left lower extremity Plan: The patient will followup with me on as-needed basis. He has continued surveillance followup of his popliteal artery intervention by Dr. Allyson Sabal.  Fabienne Bruns, MD [facility] [place] Office: [phone] Pager: [phone]

## 2013-03-11 ENCOUNTER — Other Ambulatory Visit: Payer: Self-pay | Admitting: *Deleted

## 2013-03-11 MED ORDER — CILOSTAZOL 100 MG PO TABS: 100.0000 mg | ORAL_TABLET | Freq: Two times a day (BID) | ORAL | Status: DC

## 2013-03-12 ENCOUNTER — Telehealth: Payer: Self-pay

## 2013-03-12 ENCOUNTER — Encounter: Payer: Self-pay | Admitting: Neurology

## 2013-03-12 DIAGNOSIS — R252 Cramp and spasm: Secondary | ICD-10-CM

## 2013-03-12 DIAGNOSIS — M4802 Spinal stenosis, cervical region: Secondary | ICD-10-CM | POA: Insufficient documentation

## 2013-03-12 DIAGNOSIS — E785 Hyperlipidemia, unspecified: Secondary | ICD-10-CM

## 2013-03-12 DIAGNOSIS — R269 Unspecified abnormalities of gait and mobility: Secondary | ICD-10-CM | POA: Insufficient documentation

## 2013-03-12 NOTE — Telephone Encounter (Signed)
Patient daughter calls states her father needs a sooner apt. He is in a lot of pain.  Called patient and daughter left two messages asking her to call me back for a sooner apt.

## 2013-03-12 NOTE — Telephone Encounter (Signed)
Spoke with patients daughter and told her that Dr.Yan had a cx for 03/13/2013 Byrd Hesselbach daughter will bring him to apt.

## 2013-03-13 ENCOUNTER — Encounter: Payer: Self-pay | Admitting: Neurology

## 2013-03-13 ENCOUNTER — Ambulatory Visit (INDEPENDENT_AMBULATORY_CARE_PROVIDER_SITE_OTHER): Payer: Medicare Other | Admitting: Neurology

## 2013-03-13 DIAGNOSIS — R252 Cramp and spasm: Secondary | ICD-10-CM

## 2013-03-13 DIAGNOSIS — M79609 Pain in unspecified limb: Secondary | ICD-10-CM

## 2013-03-13 DIAGNOSIS — M545 Low back pain: Secondary | ICD-10-CM | POA: Insufficient documentation

## 2013-03-13 MED ORDER — FENTANYL 50 MCG/HR TD PT72: 1.0000 | MEDICATED_PATCH | TRANSDERMAL | Status: DC
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
## 2013-03-16 ENCOUNTER — Telehealth: Payer: Self-pay | Admitting: *Deleted

## 2013-03-16 MED ORDER — DONEPEZIL HCL 10 MG PO TABS: 10.0000 mg | ORAL_TABLET | ORAL | Status: DC

## 2013-03-16 NOTE — Telephone Encounter (Signed)
The pain patch is a CII narcotic that cannot be called in.  Patient must get written Rx each time Rx needs to be filled.  I called back.  Spoke with daughter.  Explained a written Rx is needed each refill.  She verbalized understanding.  Also inquired about Aricept.  I see the OV notes say patient should take 10mg  QAM.  Rx has not been sent yet, I will send it now.  Daughter is aware he should take this medication, and aware Rx is being sent.  Pain patch Rx is ready for pick up at the front desk.  Daughter will be in later today to get Rx.

## 2013-03-16 NOTE — Telephone Encounter (Signed)
I offered to mail Rx, daughter declined.

## 2013-03-16 NOTE — Telephone Encounter (Signed)
Anthony Marsh, his daughter is calling back.  The prescription for the pain patch has not been called in.  The patient is in extreme pain and has been waiting on this since last Friday, per daughter.  Please give them back a call at:  [phone].

## 2013-03-16 NOTE — Telephone Encounter (Signed)
Maria calling for patient.  States a prescription for a patch was to have been called in to [facility].  Says she went Friday, Saturday, and Sunday and they do not have it.  Please call this in.  He is in a lot of pain. Also wants to know if he should be taking Donepezil?  Please advise.

## 2013-03-22 ENCOUNTER — Ambulatory Visit
Admission: RE | Admit: 2013-03-22 | Discharge: 2013-03-22 | Disposition: A | Discharge status: Home / Self Care | Payer: Medicare Other | Source: Ambulatory Visit | Attending: Neurology | Admitting: Neurology

## 2013-03-22 DIAGNOSIS — M545 Low back pain, unspecified: Secondary | ICD-10-CM

## 2013-03-22 DIAGNOSIS — R252 Cramp and spasm: Secondary | ICD-10-CM

## 2013-03-22 DIAGNOSIS — M79609 Pain in unspecified limb: Secondary | ICD-10-CM

## 2013-03-23 ENCOUNTER — Telehealth: Payer: Self-pay | Admitting: Neurology

## 2013-03-23 DIAGNOSIS — M48062 Spinal stenosis, lumbar region with neurogenic claudication: Secondary | ICD-10-CM | POA: Insufficient documentation

## 2013-03-23 NOTE — Telephone Encounter (Signed)
MRI showed,  Severe multilevel lumbar spondylosis most pronounced at L4-L5 where  there is severe central stenosis and lateral recess stenosis with  complete effacement of the subarachnoid space and compression of  both descending L5 nerve in the lateral recesses. Multilevel  bilateral foraminal stenosis extends from L1-L2 through L5-S1,  detailed above  I will refer him to Dr. Ellwood Handler.

## 2013-04-13 ENCOUNTER — Ambulatory Visit: Payer: Self-pay | Admitting: Neurology

## 2013-05-01 ENCOUNTER — Ambulatory Visit: Payer: Self-pay | Admitting: Neurology

## 2013-05-19 ENCOUNTER — Telehealth (HOSPITAL_COMMUNITY): Payer: Self-pay | Admitting: Cardiovascular Disease

## 2013-05-20 ENCOUNTER — Other Ambulatory Visit: Payer: Self-pay

## 2013-05-20 ENCOUNTER — Encounter (HOSPITAL_COMMUNITY): Payer: Medicare Other

## 2013-05-21 ENCOUNTER — Telehealth (HOSPITAL_COMMUNITY): Payer: Self-pay | Admitting: Cardiovascular Disease

## 2013-06-19 ENCOUNTER — Telehealth (HOSPITAL_COMMUNITY): Payer: Self-pay | Admitting: *Deleted

## 2013-06-19 NOTE — Telephone Encounter (Signed)
  [facility] LOC[facility] AT [facility] [address] [place], [place] [address] [phone]   Date:06/19/2013  Dear Dr. Allyson Sabal  Our office has attempted to contact your patient Anthony Marsh  twice by telephone and we have also sent an appointment letter to schedule the Lower Extremity Arterial Doppler test you ordered. The patient has not responded. We will not make any further attempts to contact the patient. If any further assistance is needed for this referral, please contact our office at [phone]  Sincerely, [facility] Scheduling Team

## 2013-06-22 NOTE — Telephone Encounter (Signed)
Noted. Order for doppler removed

## 2013-07-21 ENCOUNTER — Telehealth (HOSPITAL_COMMUNITY): Payer: Self-pay | Admitting: *Deleted

## 2013-08-18 ENCOUNTER — Encounter: Payer: Self-pay | Admitting: Neurology

## 2013-08-18 ENCOUNTER — Ambulatory Visit (INDEPENDENT_AMBULATORY_CARE_PROVIDER_SITE_OTHER): Payer: Medicare Other | Admitting: Neurology

## 2013-08-18 VITALS — BP 134/61 | HR 65 | Ht 66.0 in | Wt 153.0 lb

## 2013-08-18 DIAGNOSIS — I10 Essential (primary) hypertension: Secondary | ICD-10-CM

## 2013-08-18 DIAGNOSIS — E119 Type 2 diabetes mellitus without complications: Secondary | ICD-10-CM

## 2013-08-18 DIAGNOSIS — M48062 Spinal stenosis, lumbar region with neurogenic claudication: Secondary | ICD-10-CM

## 2013-08-18 DIAGNOSIS — M545 Low back pain: Secondary | ICD-10-CM

## 2013-08-18 DIAGNOSIS — I739 Peripheral vascular disease, unspecified: Secondary | ICD-10-CM

## 2013-08-18 MED ORDER — GABAPENTIN (ONCE-DAILY) 300 MG PO TABS: 300.0000 mg | ORAL_TABLET | Freq: Three times a day (TID) | ORAL | Status: DC
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
## 2013-08-20 ENCOUNTER — Other Ambulatory Visit: Payer: Self-pay

## 2014-01-05 ENCOUNTER — Encounter: Payer: Self-pay | Admitting: *Deleted

## 2014-01-05 ENCOUNTER — Ambulatory Visit (INDEPENDENT_AMBULATORY_CARE_PROVIDER_SITE_OTHER): Payer: Medicare Other | Admitting: Cardiology

## 2014-01-05 ENCOUNTER — Encounter: Payer: Self-pay | Admitting: Cardiology

## 2014-01-05 VITALS — BP 123/54 | HR 63 | Ht 68.0 in | Wt 156.0 lb

## 2014-01-05 DIAGNOSIS — M48062 Spinal stenosis, lumbar region with neurogenic claudication: Secondary | ICD-10-CM

## 2014-01-05 DIAGNOSIS — I739 Peripheral vascular disease, unspecified: Secondary | ICD-10-CM

## 2014-01-05 DIAGNOSIS — I10 Essential (primary) hypertension: Secondary | ICD-10-CM

## 2014-01-05 MED ORDER — TRAMADOL HCL 50 MG PO TABS: 50.0000 mg | ORAL_TABLET | Freq: Four times a day (QID) | ORAL | Status: DC | PRN
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
## 2014-01-05 NOTE — Patient Instructions (Signed)
Try the ultram for pain, 1 every 6 hours.  Have doppler studies done ASAP of lower ext.  Follow up with Dr. Allyson SabalBerry as soon as possible after dopplers.

## 2014-01-08 ENCOUNTER — Ambulatory Visit (HOSPITAL_COMMUNITY)
Admission: RE | Admit: 2014-01-08 | Discharge: 2014-01-08 | Disposition: A | Discharge status: Home / Self Care | Payer: Medicare Other | Source: Ambulatory Visit | Attending: Internal Medicine | Admitting: Internal Medicine

## 2014-01-08 DIAGNOSIS — I70219 Atherosclerosis of native arteries of extremities with intermittent claudication, unspecified extremity: Secondary | ICD-10-CM | POA: Insufficient documentation

## 2014-01-08 DIAGNOSIS — I739 Peripheral vascular disease, unspecified: Secondary | ICD-10-CM | POA: Insufficient documentation

## 2014-01-08 NOTE — Progress Notes (Signed)
Bilateral Lower Extremity Arterial Duplex Completed. °Brianna L Mazza,RVT °

## 2014-01-08 NOTE — Assessment & Plan Note (Signed)
Increasing leg pain, bil leg pain with known PAD.  Will repeat lower ext art dopplers with follow up with Dr. Allyson SabalBerry.  This could be due to back pain but with his PAD will need to evaluate.

## 2014-01-08 NOTE — Assessment & Plan Note (Signed)
Has had recent steroid injections without improvement in leg symptoms.

## 2014-01-08 NOTE — Assessment & Plan Note (Signed)
controlled 

## 2014-01-08 NOTE — Assessment & Plan Note (Signed)
RT Fem-pop bpg 2005 in CN as well.  Check dopplers and may need to proceed to PV angio.

## 2014-01-13 ENCOUNTER — Ambulatory Visit (INDEPENDENT_AMBULATORY_CARE_PROVIDER_SITE_OTHER): Payer: Medicare Other | Admitting: Cardiovascular Disease

## 2014-01-13 ENCOUNTER — Encounter: Payer: Self-pay | Admitting: Cardiovascular Disease

## 2014-01-13 VITALS — BP 127/56 | HR 66 | Ht 68.0 in | Wt 157.0 lb

## 2014-01-13 DIAGNOSIS — I6529 Occlusion and stenosis of unspecified carotid artery: Secondary | ICD-10-CM

## 2014-01-13 DIAGNOSIS — E785 Hyperlipidemia, unspecified: Secondary | ICD-10-CM

## 2014-01-13 DIAGNOSIS — I10 Essential (primary) hypertension: Secondary | ICD-10-CM

## 2014-01-13 DIAGNOSIS — I739 Peripheral vascular disease, unspecified: Secondary | ICD-10-CM

## 2014-01-13 NOTE — Assessment & Plan Note (Signed)
History of right femoropopliteal bypass graft, left common iliac artery stenting and diamondback or rotational atherectomy mid left SFA complicated by retained NAV 6 distal protection device requiring surgical extraction, endarterectomy and patch angioplasty by Dr. Darrick PennaFields 11/04/12.he has been complaining of bilateral hip thigh and leg pain. Recent lower extremity Dopplers performed 01/08/14 revealed a left ABI 0.8 with a patent left common iliac artery stent and patent left SFA. His right ABIs on the 0.58 with a patent right femoropopliteal bypass graft and an occluded right popliteal artery. I do not think that his symptoms are consistent with his anatomy or Doppler findings since he has bilateral discomfort but polypoid disease only on the right side. At this point his symptoms are more neurologic patient does have an L5 problem and is being seen by Dr. Jeral FruitBotero, neurosurgeon. He has had a injections without symptomatic relief.

## 2014-01-13 NOTE — Progress Notes (Signed)
01/13/2014 Anthony Marsh   1923/07/19  [id number]  Primary Physician Charlott Rakes, MD Primary Cardiologist: Runell Gess MD Roseanne Reno   HPI:  The patient is a delightful 80 year old thin-appearing widowed Latino male, father of 1, grandfather to 3 grandchildren, who is accompanied by his daughter today. He was originally referred to me by Dr. [place] Hilty because of claudication. His risk factors include hypertension and hyperlipidemia. He had remote right femoral-popliteal bypass grafting in [place] in 2005. He does complain of lifestyle-limiting claudication. He had abnormal Dopplers, and angiography revealed moderate iliac, SFA, and tibial vessel disease. I stented his left common iliac artery and ended up coming back and performing Diamondback orbital rotational atherectomy of his left SFA using antegrade approach. Ultimately, it was discovered that he had a retained distal embolic protection device in his mid left SFA and underwent surgical extraction, endarterectomy, and patch angioplasty by Dr. Darrick Penna on November 04, 2012. Dr. Darrick Penna has seen him back in the office. He has a small resolving hematoma at the surgical incision site. His followup Dopplers at [facility] show a patent SFA. He did have a negative Myoview on July 30, 2012, and normal carotid Dopplers. He saw Nada Boozer registered nurse practitioner recently complaining of pain in his legs bilaterally. Dopplers showed widely patent left common iliac artery stent and left SFA with a left ABI of 0.8. His right femoropopliteal bypass graft was patent there was right popliteal was occluded with a decrease in his right ABI .58    Current Outpatient Prescriptions  Medication Sig Dispense Refill  . carvedilol (COREG) 6.25 MG tablet Take 6.25 mg by mouth 2 (two) times daily with a meal. Pt takes this medication at 8 am and 8 pm      . cilostazol (PLETAL) 100 MG tablet Take 1 tablet (100 mg total) by mouth 2 (two)  times daily.  30 tablet  11  . donepezil (ARICEPT) 10 MG tablet Take 1 tablet (10 mg total) by mouth every morning.  90 tablet  3  . ferrous fumarate (HEMOCYTE - 106 MG FE) 325 (106 FE) MG TABS tablet Take 1 tablet by mouth daily.      . Gabapentin, PHN, 300 MG TABS Take 300 mg by mouth 3 (three) times daily.  90 tablet  12  . lisinopril (PRINIVIL,ZESTRIL) 10 MG tablet Take 10 mg by mouth daily.      Marland Kitchen omeprazole (PRILOSEC) 40 MG capsule       . sertraline (ZOLOFT) 100 MG tablet Take 50 mg by mouth daily.       . simvastatin (ZOCOR) 40 MG tablet Take 40 mg by mouth daily.       . traMADol (ULTRAM) 50 MG tablet Take 1 tablet (50 mg total) by mouth every 6 (six) hours as needed.  30 tablet  0   No current facility-administered medications for this visit.    Allergies  Allergen Reactions  . Other Other (See Comments)    Unknown Anesthesia problems; patient experiences anger and other unknown feelings associated with Anesthesia.  Patient doesn't know what type of Anesthesia as a result.       History   Social History  . Marital Status: Widowed    Spouse Name: N/A    Number of Children: 1  . Years of Education: 12   Occupational History  . retired    Social History Main Topics  . Smoking status: Former Smoker    Types: Cigarettes, Cigars    Quit  date: 05/22/1981  . Smokeless tobacco: Never Used  . Alcohol Use: No  . Drug Use: No  . Sexual Activity: No   Other Topics Concern  . Not on file   Social History Narrative   Patient is right handed male, Patient lives at home with his daughter Byrd Hesselbach(Maria). Patient is a widowed. Caffeine three cups daily.  Patient has a high school education.     Review of Systems: General: negative for chills, fever, night sweats or weight changes.  Cardiovascular: negative for chest pain, dyspnea on exertion, edema, orthopnea, palpitations, paroxysmal nocturnal dyspnea or shortness of breath Dermatological: negative for rash Respiratory: negative  for cough or wheezing Urologic: negative for hematuria Abdominal: negative for nausea, vomiting, diarrhea, bright red blood per rectum, melena, or hematemesis Neurologic: negative for visual changes, syncope, or dizziness All other systems reviewed and are otherwise negative except as noted above.    Blood pressure 127/56, pulse 66, height 5\' 8"  (1.727 m), weight 157 lb (71.215 kg).  General appearance: alert and no distress Neck: no adenopathy, no JVD, supple, symmetrical, trachea midline, thyroid not enlarged, symmetric, no tenderness/mass/nodules and bbilateral carotid bruits Lungs: clear to auscultation bilaterally Heart: regular rate and rhythm, S1, S2 normal, no murmur, click, rub or gallop Extremities: extremities normal, atraumatic, no cyanosis or edema and 2+ femoral without bruits  EKG not performed today  ASSESSMENT AND PLAN:   PVD S/P Lt [employer] pta/ stent 08/05/12, Lt SFA PTA/HSRA 10/01/12 History of right femoropopliteal bypass graft, left common iliac artery stenting and diamondback or rotational atherectomy mid left SFA complicated by retained NAV 6 distal protection device requiring surgical extraction, endarterectomy and patch angioplasty by Dr. Darrick PennaFields 11/04/12.he has been complaining of bilateral hip thigh and leg pain. Recent lower extremity Dopplers performed 01/08/14 revealed a left ABI 0.8 with a patent left common iliac artery stent and patent left SFA. His right ABIs on the 0.58 with a patent right femoropopliteal bypass graft and an occluded right popliteal artery. I do not think that his symptoms are consistent with his anatomy or Doppler findings since he has bilateral discomfort but polypoid disease only on the right side. At this point his symptoms are more neurologic patient does have an L5 problem and is being seen by Dr. Jeral FruitBotero, neurosurgeon. He has had a injections without symptomatic relief.  HTN (hypertension) Controlled on current medications  Other and  unspecified hyperlipidemia On statin therapy followed by his PCP      Runell GessJonathan J. Sofia Jaquith MD [facility] Surgery CenterFACP,FACC,FAHA, [facility] IncFSCAI 01/13/2014 12:26 PM

## 2014-01-13 NOTE — Assessment & Plan Note (Signed)
Controlled on current medications 

## 2014-01-13 NOTE — Assessment & Plan Note (Signed)
On statin therapy followed by his PCP 

## 2014-01-13 NOTE — Patient Instructions (Signed)
  We will see you back in follow up in 6 months with an extender Nada Boozer(Laura Ingold NP) and 1 year with Dr Allyson SabalBerry.  Dr Allyson SabalBerry has ordered :  Carotid Duplex- This test is an ultrasound of the carotid arteries in your neck. It looks at blood flow through these arteries that supply the brain with blood. Allow one hour for this exam. There are no restrictions or special instructions.

## 2014-01-18 ENCOUNTER — Other Ambulatory Visit: Payer: Self-pay | Admitting: Internal Medicine

## 2014-01-18 ENCOUNTER — Inpatient Hospital Stay (HOSPITAL_COMMUNITY): Admission: RE | Admit: 2014-01-18 | Payer: Medicare Other | Source: Ambulatory Visit

## 2014-01-18 NOTE — Telephone Encounter (Signed)
Rx was sent to pharmacy electronically. 

## 2014-01-25 ENCOUNTER — Telehealth (HOSPITAL_COMMUNITY): Payer: Self-pay | Admitting: *Deleted

## 2014-02-02 ENCOUNTER — Telehealth (HOSPITAL_COMMUNITY): Payer: Self-pay | Admitting: *Deleted

## 2014-02-03 ENCOUNTER — Other Ambulatory Visit: Payer: Self-pay | Admitting: Internal Medicine

## 2014-02-15 ENCOUNTER — Other Ambulatory Visit: Payer: Self-pay | Admitting: Neurosurgery

## 2014-02-16 ENCOUNTER — Ambulatory Visit: Payer: Medicare Other | Admitting: Neurology

## 2014-02-18 ENCOUNTER — Encounter (HOSPITAL_COMMUNITY): Payer: Medicare Other

## 2014-02-19 ENCOUNTER — Encounter (HOSPITAL_COMMUNITY): Payer: Self-pay | Admitting: Pharmacy Technician

## 2014-02-20 ENCOUNTER — Other Ambulatory Visit: Payer: Self-pay | Admitting: Internal Medicine

## 2014-02-22 NOTE — Telephone Encounter (Signed)
Rx was sent to pharmacy electronically. 

## 2014-02-24 ENCOUNTER — Ambulatory Visit (HOSPITAL_COMMUNITY)
Admission: RE | Admit: 2014-02-24 | Discharge: 2014-02-24 | Disposition: A | Discharge status: Home / Self Care | Payer: Medicare Other | Source: Ambulatory Visit | Attending: Anesthesiology | Admitting: Anesthesiology

## 2014-02-24 ENCOUNTER — Encounter (HOSPITAL_COMMUNITY): Payer: Self-pay

## 2014-02-24 ENCOUNTER — Encounter (HOSPITAL_COMMUNITY)
Admission: RE | Admit: 2014-02-24 | Discharge: 2014-02-24 | Disposition: A | Discharge status: Home / Self Care | Payer: Medicare Other | Source: Ambulatory Visit | Attending: Neurosurgery | Admitting: Neurosurgery

## 2014-02-24 DIAGNOSIS — I2510 Atherosclerotic heart disease of native coronary artery without angina pectoris: Secondary | ICD-10-CM | POA: Insufficient documentation

## 2014-02-24 LAB — PROTIME-INR
INR: 1.01 (ref 0.00–1.49)
PROTHROMBIN TIME: 13.1 s (ref 11.6–15.2)

## 2014-02-24 LAB — CBC
HCT: 37.1 % — ABNORMAL LOW (ref 39.0–52.0)
Hemoglobin: 12.6 g/dL — ABNORMAL LOW (ref 13.0–17.0)
MCH: 31.4 pg (ref 26.0–34.0)
MCHC: 34 g/dL (ref 30.0–36.0)
MCV: 92.5 fL (ref 78.0–100.0)
PLATELETS: 251 10*3/uL (ref 150–400)
RBC: 4.01 MIL/uL — AB (ref 4.22–5.81)
RDW: 12.8 % (ref 11.5–15.5)
WBC: 9.5 10*3/uL (ref 4.0–10.5)

## 2014-02-24 LAB — SURGICAL PCR SCREEN
MRSA, PCR: NEGATIVE
STAPHYLOCOCCUS AUREUS: NEGATIVE

## 2014-02-24 LAB — TYPE AND SCREEN
ABO/RH(D): AB POS
ANTIBODY SCREEN: NEGATIVE

## 2014-02-24 LAB — BASIC METABOLIC PANEL
BUN: 22 mg/dL (ref 6–23)
CO2: 24 mEq/L (ref 19–32)
Calcium: 9.2 mg/dL (ref 8.4–10.5)
Chloride: 102 mEq/L (ref 96–112)
Creatinine, Ser: 1.01 mg/dL (ref 0.50–1.35)
GFR, EST AFRICAN AMERICAN: 74 mL/min — AB (ref >90)
GFR, EST NON AFRICAN AMERICAN: 64 mL/min — AB (ref >90)
Glucose, Bld: 88 mg/dL (ref 70–99)
Potassium: 4.5 mEq/L (ref 3.7–5.3)
SODIUM: 139 meq/L (ref 137–147)

## 2014-02-24 LAB — APTT: APTT: 31 s (ref 24–37)

## 2014-02-24 NOTE — Pre-Procedure Instructions (Signed)
Dollene PrimroseManuel Eberwein  02/24/2014   Your procedure is scheduled on: Friday, Feb 26, 2014  Report to [facility] Short Stay (use Main Entrance "A'') at 11:30 AM.  Call this number if you have problems the morning of surgery: [phone]   Remember:   Do not eat food or drink liquids after midnight.   Take these medicines the morning of surgery with A SIP OF WATER: carvedilol (COREG), donepezil (ARICEPT), gabapentin (NEURONTIN, omeprazole (PRILOSEC) , sertraline (ZOLOFT) Stop taking Aspirin, Coumadin, Plavix, Effient, and herbal medications. Do not take any NSAIDs ie: Ibuprofen, Advil, Naproxen or any medication containing Aspirin.  Do not wear jewelry, make-up or nail polish.  Do not wear lotions, powders, or perfumes. You may wear deodorant.  Do not shave 48 hours prior to surgery. Men may shave face and neck.  Do not bring valuables to the hospital.  [facility] is not responsible  for any belongings or valuables.               Contacts, dentures or bridgework may not be worn into surgery.  Leave suitcase in the car. After surgery it may be brought to your room.  For patients admitted to the hospital, discharge time is determined by your  treatment team.               Patients discharged the day of surgery will not be allowed to drive home.  Name and phone number of your driver:   Special Instructions:  Special Instructions:Special Instructions: [facility] - Preparing for Surgery  Before surgery, you can play an important role.  Because skin is not sterile, your skin needs to be as free of germs as possible.  You can reduce the number of germs on you skin by washing with CHG (chlorahexidine gluconate) soap before surgery.  CHG is an antiseptic cleaner which kills germs and bonds with the skin to continue killing germs even after washing.  Please DO NOT use if you have an allergy to CHG or antibacterial soaps.  If your skin becomes reddened/irritated stop using the CHG and inform your nurse  when you arrive at Short Stay.  Do not shave (including legs and underarms) for at least 48 hours prior to the first CHG shower.  You may shave your face.  Please follow these instructions carefully:   1.  Shower with CHG Soap the night before surgery and the morning of Surgery.  2.  If you choose to wash your hair, wash your hair first as usual with your normal shampoo.  3.  After you shampoo, rinse your hair and body thoroughly to remove the Shampoo.  4.  Use CHG as you would any other liquid soap.  You can apply chg directly  to the skin and wash gently with scrungie or a clean washcloth.  5.  Apply the CHG Soap to your body ONLY FROM THE NECK DOWN.  Do not use on open wounds or open sores.  Avoid contact with your eyes, ears, mouth and genitals (private parts).  Wash genitals (private parts) with your normal soap.  6.  Wash thoroughly, paying special attention to the area where your surgery will be performed.  7.  Thoroughly rinse your body with warm water from the neck down.  8.  DO NOT shower/wash with your normal soap after using and rinsing off the CHG Soap.  9.  Pat yourself dry with a clean towel.            10.  Wear clean pajamas.            11.  Place clean sheets on your bed the night of your first shower and do not sleep with pets.  Day of Surgery  Do not apply any lotions the morning of surgery.  Please wear clean clothes to the hospital/surgery center.   Please read over the following fact sheets that you were given: Pain Booklet, Coughing and Deep Breathing, Blood Transfusion Information, MRSA Information and Surgical Site Infection Prevention

## 2014-02-25 MED ORDER — CEFAZOLIN SODIUM-DEXTROSE 2-3 GM-% IV SOLR: 2.0000 g | INTRAVENOUS | Status: AC

## 2014-03-03 ENCOUNTER — Other Ambulatory Visit: Payer: Self-pay

## 2014-03-03 MED ORDER — DONEPEZIL HCL 10 MG PO TABS: 10.0000 mg | ORAL_TABLET | Freq: Every day | ORAL | Status: DC
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
## 2014-03-09 ENCOUNTER — Inpatient Hospital Stay (HOSPITAL_COMMUNITY): Payer: Medicare Other

## 2014-03-09 ENCOUNTER — Encounter (HOSPITAL_COMMUNITY)
Admission: RE | Disposition: A | Discharge status: Skilled Nursing Facility | Payer: Medicare Other | Source: Ambulatory Visit | Attending: Neurosurgery

## 2014-03-09 ENCOUNTER — Inpatient Hospital Stay (HOSPITAL_COMMUNITY): Payer: Medicare Other | Admitting: Anesthesiology

## 2014-03-09 ENCOUNTER — Inpatient Hospital Stay (HOSPITAL_COMMUNITY)
Admission: RE | Admit: 2014-03-09 | Discharge: 2014-03-15 | DRG: 460 | Disposition: A | Discharge status: Skilled Nursing Facility | Payer: Medicare Other | Source: Ambulatory Visit | Attending: Neurosurgery | Admitting: Neurosurgery

## 2014-03-09 ENCOUNTER — Encounter (HOSPITAL_COMMUNITY): Payer: Self-pay | Admitting: Anesthesiology

## 2014-03-09 ENCOUNTER — Encounter (HOSPITAL_COMMUNITY): Payer: Medicare Other | Admitting: Anesthesiology

## 2014-03-09 DIAGNOSIS — F3289 Other specified depressive episodes: Secondary | ICD-10-CM | POA: Diagnosis present

## 2014-03-09 DIAGNOSIS — Z981 Arthrodesis status: Secondary | ICD-10-CM

## 2014-03-09 DIAGNOSIS — I739 Peripheral vascular disease, unspecified: Secondary | ICD-10-CM | POA: Diagnosis present

## 2014-03-09 DIAGNOSIS — E785 Hyperlipidemia, unspecified: Secondary | ICD-10-CM | POA: Diagnosis present

## 2014-03-09 DIAGNOSIS — Z87891 Personal history of nicotine dependence: Secondary | ICD-10-CM

## 2014-03-09 DIAGNOSIS — R269 Unspecified abnormalities of gait and mobility: Secondary | ICD-10-CM | POA: Diagnosis present

## 2014-03-09 DIAGNOSIS — M4316 Spondylolisthesis, lumbar region: Secondary | ICD-10-CM | POA: Diagnosis present

## 2014-03-09 DIAGNOSIS — F329 Major depressive disorder, single episode, unspecified: Secondary | ICD-10-CM | POA: Diagnosis present

## 2014-03-09 DIAGNOSIS — M48061 Spinal stenosis, lumbar region without neurogenic claudication: Principal | ICD-10-CM | POA: Diagnosis present

## 2014-03-09 DIAGNOSIS — M129 Arthropathy, unspecified: Secondary | ICD-10-CM | POA: Diagnosis present

## 2014-03-09 DIAGNOSIS — I10 Essential (primary) hypertension: Secondary | ICD-10-CM | POA: Diagnosis present

## 2014-03-09 LAB — GLUCOSE, CAPILLARY
GLUCOSE-CAPILLARY: 186 mg/dL — AB (ref 70–99)
Glucose-Capillary: 184 mg/dL — ABNORMAL HIGH (ref 70–99)

## 2014-03-09 SURGERY — POSTERIOR LUMBAR FUSION 1 LEVEL
Anesthesia: General | Site: Back

## 2014-03-09 MED ORDER — LACTATED RINGERS IV SOLN
INTRAVENOUS | Status: DC
  Administered 2014-03-09: 10:00:00 via INTRAVENOUS

## 2014-03-09 MED ORDER — LABETALOL HCL 5 MG/ML IV SOLN
INTRAVENOUS | Status: AC
  Administered 2014-03-09: 20 mg via INTRAVENOUS
  Filled 2014-03-09: qty 4

## 2014-03-09 MED ORDER — CEFAZOLIN SODIUM 1-5 GM-% IV SOLN
1.0000 g | Freq: Three times a day (TID) | INTRAVENOUS | Status: AC
  Administered 2014-03-09 – 2014-03-10 (×2): 1 g via INTRAVENOUS
  Filled 2014-03-09 (×2): qty 50

## 2014-03-09 MED ORDER — ACETAMINOPHEN 650 MG RE SUPP: 650.0000 mg | RECTAL | Status: DC | PRN

## 2014-03-09 MED ORDER — CEFAZOLIN SODIUM-DEXTROSE 2-3 GM-% IV SOLR
2.0000 g | Freq: Once | INTRAVENOUS | Status: AC
  Administered 2014-03-09: 2 g via INTRAVENOUS
  Filled 2014-03-09: qty 50

## 2014-03-09 MED ORDER — SERTRALINE HCL 100 MG PO TABS
100.0000 mg | ORAL_TABLET | Freq: Every day | ORAL | Status: DC
  Administered 2014-03-09 – 2014-03-15 (×7): 100 mg via ORAL
  Filled 2014-03-09 (×8): qty 1

## 2014-03-09 MED ORDER — FENTANYL CITRATE 0.05 MG/ML IJ SOLN
25.0000 ug | INTRAMUSCULAR | Status: AC | PRN
  Administered 2014-03-09 (×6): 25 ug via INTRAVENOUS

## 2014-03-09 MED ORDER — SODIUM CHLORIDE 0.9 % IV SOLN: 250.0000 mL | INTRAVENOUS | Status: DC

## 2014-03-09 MED ORDER — ONDANSETRON HCL 4 MG/2ML IJ SOLN
INTRAMUSCULAR | Status: DC | PRN
  Administered 2014-03-09: 4 mg via INTRAVENOUS

## 2014-03-09 MED ORDER — DIAZEPAM 5 MG PO TABS
5.0000 mg | ORAL_TABLET | Freq: Four times a day (QID) | ORAL | Status: DC | PRN
  Administered 2014-03-13: 5 mg via ORAL
  Filled 2014-03-09: qty 1

## 2014-03-09 MED ORDER — NEOSTIGMINE METHYLSULFATE 10 MG/10ML IV SOLN
INTRAVENOUS | Status: DC | PRN
  Administered 2014-03-09: 3 mg via INTRAVENOUS

## 2014-03-09 MED ORDER — HYDROMORPHONE HCL PF 1 MG/ML IJ SOLN
INTRAMUSCULAR | Status: AC
  Administered 2014-03-09: 0.5 mg via INTRAVENOUS
  Filled 2014-03-09: qty 1

## 2014-03-09 MED ORDER — GLYCOPYRROLATE 0.2 MG/ML IJ SOLN
INTRAMUSCULAR | Status: DC | PRN
  Administered 2014-03-09: 0.4 mg via INTRAVENOUS
  Administered 2014-03-09: 0.2 mg via INTRAVENOUS

## 2014-03-09 MED ORDER — DONEPEZIL HCL 10 MG PO TABS
10.0000 mg | ORAL_TABLET | Freq: Every day | ORAL | Status: DC
  Administered 2014-03-10 – 2014-03-15 (×6): 10 mg via ORAL
  Filled 2014-03-09 (×8): qty 1

## 2014-03-09 MED ORDER — PROPOFOL 10 MG/ML IV BOLUS
INTRAVENOUS | Status: AC
  Filled 2014-03-09: qty 20

## 2014-03-09 MED ORDER — ROCURONIUM BROMIDE 50 MG/5ML IV SOLN
INTRAVENOUS | Status: AC
  Filled 2014-03-09: qty 1

## 2014-03-09 MED ORDER — PHENOL 1.4 % MT LIQD
1.0000 | OROMUCOSAL | Status: DC | PRN
  Filled 2014-03-09: qty 177

## 2014-03-09 MED ORDER — SODIUM CHLORIDE 0.9 % IJ SOLN
3.0000 mL | Freq: Two times a day (BID) | INTRAMUSCULAR | Status: DC
  Administered 2014-03-10 – 2014-03-15 (×9): 3 mL via INTRAVENOUS

## 2014-03-09 MED ORDER — ARTIFICIAL TEARS OP OINT
TOPICAL_OINTMENT | OPHTHALMIC | Status: AC
  Filled 2014-03-09: qty 3.5

## 2014-03-09 MED ORDER — ONDANSETRON HCL 4 MG/2ML IJ SOLN: 4.0000 mg | INTRAMUSCULAR | Status: DC | PRN

## 2014-03-09 MED ORDER — ROCURONIUM BROMIDE 100 MG/10ML IV SOLN
INTRAVENOUS | Status: DC | PRN
  Administered 2014-03-09: 50 mg via INTRAVENOUS

## 2014-03-09 MED ORDER — VECURONIUM BROMIDE 10 MG IV SOLR
INTRAVENOUS | Status: DC | PRN
  Administered 2014-03-09 (×2): 2 mg via INTRAVENOUS

## 2014-03-09 MED ORDER — SODIUM CHLORIDE 0.9 % IJ SOLN: 3.0000 mL | INTRAMUSCULAR | Status: DC | PRN

## 2014-03-09 MED ORDER — LIDOCAINE HCL (CARDIAC) 20 MG/ML IV SOLN
INTRAVENOUS | Status: DC | PRN
  Administered 2014-03-09: 100 mg via INTRAVENOUS

## 2014-03-09 MED ORDER — LIDOCAINE HCL (CARDIAC) 20 MG/ML IV SOLN
INTRAVENOUS | Status: AC
  Filled 2014-03-09: qty 5

## 2014-03-09 MED ORDER — SIMVASTATIN 40 MG PO TABS
40.0000 mg | ORAL_TABLET | Freq: Every day | ORAL | Status: DC
  Administered 2014-03-09 – 2014-03-15 (×7): 40 mg via ORAL
  Filled 2014-03-09 (×8): qty 1

## 2014-03-09 MED ORDER — LABETALOL HCL 5 MG/ML IV SOLN
20.0000 mg | Freq: Once | INTRAVENOUS | Status: AC
  Administered 2014-03-09: 20 mg via INTRAVENOUS

## 2014-03-09 MED ORDER — FENTANYL CITRATE 0.05 MG/ML IJ SOLN
INTRAMUSCULAR | Status: AC
  Filled 2014-03-09: qty 5

## 2014-03-09 MED ORDER — VANCOMYCIN HCL 1000 MG IV SOLR
INTRAVENOUS | Status: AC
  Filled 2014-03-09: qty 2000

## 2014-03-09 MED ORDER — ACETAMINOPHEN 325 MG PO TABS
650.0000 mg | ORAL_TABLET | ORAL | Status: DC | PRN
  Administered 2014-03-12 – 2014-03-13 (×3): 650 mg via ORAL
  Filled 2014-03-09 (×3): qty 2

## 2014-03-09 MED ORDER — PANTOPRAZOLE SODIUM 40 MG PO TBEC
40.0000 mg | DELAYED_RELEASE_TABLET | Freq: Every day | ORAL | Status: DC
  Administered 2014-03-09 – 2014-03-15 (×6): 40 mg via ORAL
  Filled 2014-03-09 (×6): qty 1

## 2014-03-09 MED ORDER — CARVEDILOL 6.25 MG PO TABS
6.2500 mg | ORAL_TABLET | Freq: Two times a day (BID) | ORAL | Status: DC
  Administered 2014-03-09 – 2014-03-15 (×9): 6.25 mg via ORAL
  Filled 2014-03-09 (×14): qty 1

## 2014-03-09 MED ORDER — FENTANYL CITRATE 0.05 MG/ML IJ SOLN
INTRAMUSCULAR | Status: AC
  Administered 2014-03-09: 25 ug via INTRAVENOUS
  Filled 2014-03-09: qty 2

## 2014-03-09 MED ORDER — LISINOPRIL 10 MG PO TABS
10.0000 mg | ORAL_TABLET | Freq: Every day | ORAL | Status: DC
  Administered 2014-03-12 – 2014-03-13 (×2): 10 mg via ORAL
  Filled 2014-03-09 (×7): qty 1

## 2014-03-09 MED ORDER — PROPOFOL 10 MG/ML IV BOLUS
INTRAVENOUS | Status: DC | PRN
  Administered 2014-03-09: 160 mg via INTRAVENOUS

## 2014-03-09 MED ORDER — GABAPENTIN 300 MG PO CAPS
300.0000 mg | ORAL_CAPSULE | Freq: Three times a day (TID) | ORAL | Status: DC
  Administered 2014-03-09 – 2014-03-15 (×18): 300 mg via ORAL
  Filled 2014-03-09 (×23): qty 1

## 2014-03-09 MED ORDER — THROMBIN 20000 UNITS EX SOLR
CUTANEOUS | Status: DC | PRN
  Administered 2014-03-09: 11:00:00 via TOPICAL

## 2014-03-09 MED ORDER — GLYCOPYRROLATE 0.2 MG/ML IJ SOLN
INTRAMUSCULAR | Status: AC
  Filled 2014-03-09: qty 2

## 2014-03-09 MED ORDER — FENTANYL CITRATE 0.05 MG/ML IJ SOLN
INTRAMUSCULAR | Status: DC | PRN
  Administered 2014-03-09: 100 ug via INTRAVENOUS
  Administered 2014-03-09 (×4): 50 ug via INTRAVENOUS
  Administered 2014-03-09 (×2): 25 ug via INTRAVENOUS

## 2014-03-09 MED ORDER — LABETALOL HCL 5 MG/ML IV SOLN
INTRAVENOUS | Status: DC | PRN
  Administered 2014-03-09 (×2): 5 mg via INTRAVENOUS

## 2014-03-09 MED ORDER — 0.9 % SODIUM CHLORIDE (POUR BTL) OPTIME
TOPICAL | Status: DC | PRN
  Administered 2014-03-09: 1000 mL

## 2014-03-09 MED ORDER — BUPIVACAINE LIPOSOME 1.3 % IJ SUSP
20.0000 mL | Freq: Once | INTRAMUSCULAR | Status: DC
  Filled 2014-03-09: qty 20

## 2014-03-09 MED ORDER — ALBUMIN HUMAN 5 % IV SOLN
INTRAVENOUS | Status: DC | PRN
  Administered 2014-03-09: 15:00:00 via INTRAVENOUS

## 2014-03-09 MED ORDER — OXYCODONE-ACETAMINOPHEN 5-325 MG PO TABS
1.0000 | ORAL_TABLET | ORAL | Status: DC | PRN
  Administered 2014-03-10: 2 via ORAL
  Administered 2014-03-10 – 2014-03-15 (×6): 1 via ORAL
  Filled 2014-03-09 (×3): qty 1
  Filled 2014-03-09: qty 2
  Filled 2014-03-09 (×3): qty 1

## 2014-03-09 MED ORDER — BUPIVACAINE LIPOSOME 1.3 % IJ SUSP
INTRAMUSCULAR | Status: DC | PRN
  Administered 2014-03-09: 20 mL

## 2014-03-09 MED ORDER — INSULIN ASPART 100 UNIT/ML ~~LOC~~ SOLN: 0.0000 [IU] | Freq: Every day | SUBCUTANEOUS | Status: DC

## 2014-03-09 MED ORDER — NEOSTIGMINE METHYLSULFATE 10 MG/10ML IV SOLN
INTRAVENOUS | Status: AC
  Filled 2014-03-09: qty 1

## 2014-03-09 MED ORDER — MENTHOL 3 MG MT LOZG
1.0000 | LOZENGE | OROMUCOSAL | Status: DC | PRN
  Filled 2014-03-09: qty 9

## 2014-03-09 MED ORDER — SODIUM CHLORIDE 0.9 % IV SOLN
INTRAVENOUS | Status: DC
  Administered 2014-03-09: 500 mL via INTRAVENOUS
  Administered 2014-03-10: 13:00:00 via INTRAVENOUS

## 2014-03-09 MED ORDER — SENNOSIDES-DOCUSATE SODIUM 8.6-50 MG PO TABS: 1.0000 | ORAL_TABLET | Freq: Every evening | ORAL | Status: DC | PRN

## 2014-03-09 MED ORDER — PHENYLEPHRINE HCL 10 MG/ML IJ SOLN
10.0000 mg | INTRAVENOUS | Status: DC | PRN
  Administered 2014-03-09: 25 ug/min via INTRAVENOUS
  Administered 2014-03-09: 20 ug/min via INTRAVENOUS

## 2014-03-09 MED ORDER — MORPHINE SULFATE 2 MG/ML IJ SOLN
1.0000 mg | INTRAMUSCULAR | Status: DC | PRN
  Administered 2014-03-15: 1 mg via INTRAVENOUS
  Filled 2014-03-09: qty 1

## 2014-03-09 MED ORDER — ZOLPIDEM TARTRATE 5 MG PO TABS
5.0000 mg | ORAL_TABLET | Freq: Every evening | ORAL | Status: DC | PRN
  Administered 2014-03-11: 5 mg via ORAL
  Filled 2014-03-09: qty 1

## 2014-03-09 MED ORDER — ARTIFICIAL TEARS OP OINT
TOPICAL_OINTMENT | OPHTHALMIC | Status: DC | PRN
  Administered 2014-03-09: 1 via OPHTHALMIC

## 2014-03-09 MED ORDER — HYDRALAZINE HCL 20 MG/ML IJ SOLN
5.0000 mg | INTRAMUSCULAR | Status: DC | PRN
  Filled 2014-03-09: qty 1

## 2014-03-09 MED ORDER — VANCOMYCIN HCL 1000 MG IV SOLR
INTRAVENOUS | Status: DC | PRN
  Administered 2014-03-09 (×2): 1000 mg via TOPICAL

## 2014-03-09 MED ORDER — LACTATED RINGERS IV SOLN
INTRAVENOUS | Status: DC | PRN
  Administered 2014-03-09 (×2): via INTRAVENOUS

## 2014-03-09 MED ORDER — HYDROMORPHONE HCL PF 1 MG/ML IJ SOLN
0.2000 mg | INTRAMUSCULAR | Status: AC | PRN
  Administered 2014-03-09 (×4): 0.5 mg via INTRAVENOUS

## 2014-03-09 MED ORDER — INSULIN ASPART 100 UNIT/ML ~~LOC~~ SOLN
0.0000 [IU] | Freq: Three times a day (TID) | SUBCUTANEOUS | Status: DC
  Administered 2014-03-10: 2 [IU] via SUBCUTANEOUS
  Administered 2014-03-10: 5 [IU] via SUBCUTANEOUS
  Administered 2014-03-10 – 2014-03-12 (×3): 2 [IU] via SUBCUTANEOUS
  Administered 2014-03-12: 3 [IU] via SUBCUTANEOUS
  Administered 2014-03-13: 2 [IU] via SUBCUTANEOUS
  Administered 2014-03-14: 5 [IU] via SUBCUTANEOUS

## 2014-03-09 MED ORDER — ONDANSETRON HCL 4 MG/2ML IJ SOLN
INTRAMUSCULAR | Status: AC
  Filled 2014-03-09: qty 2

## 2014-03-09 MED ORDER — PHENYLEPHRINE HCL 10 MG/ML IJ SOLN
INTRAMUSCULAR | Status: DC | PRN
  Administered 2014-03-09: 40 ug via INTRAVENOUS
  Administered 2014-03-09: 80 ug via INTRAVENOUS
  Administered 2014-03-09 (×2): 40 ug via INTRAVENOUS

## 2014-03-09 SURGICAL SUPPLY — 75 items
BENZOIN TINCTURE PRP APPL 2/3 (GAUZE/BANDAGES/DRESSINGS) ×3 IMPLANT
BLADE 10 SAFETY STRL DISP (BLADE) IMPLANT
BLADE SURG ROTATE 9660 (MISCELLANEOUS) IMPLANT
BUR ACORN 6.0 (BURR) ×2 IMPLANT
BUR ACORN 6.0MM (BURR) ×1
BUR MATCHSTICK NEURO 3.0 LAGG (BURR) ×3 IMPLANT
CANISTER SUCT 3000ML (MISCELLANEOUS) ×3 IMPLANT
CAP REVERE LOCKING (Cap) ×12 IMPLANT
CLOSURE WOUND 1/2 X4 (GAUZE/BANDAGES/DRESSINGS) ×1
CONT SPEC 4OZ CLIKSEAL STRL BL (MISCELLANEOUS) ×9 IMPLANT
COVER BACK TABLE 24X17X13 BIG (DRAPES) IMPLANT
COVER TABLE BACK 60X90 (DRAPES) ×3 IMPLANT
DRAPE C-ARM 42X72 X-RAY (DRAPES) ×6 IMPLANT
DRAPE LAPAROTOMY 100X72X124 (DRAPES) ×3 IMPLANT
DRAPE POUCH INSTRU U-SHP 10X18 (DRAPES) ×3 IMPLANT
DRAPE PROXIMA HALF (DRAPES) ×3 IMPLANT
DRSG PAD ABDOMINAL 8X10 ST (GAUZE/BANDAGES/DRESSINGS) ×3 IMPLANT
DURAPREP 26ML APPLICATOR (WOUND CARE) ×3 IMPLANT
ELECT REM PT RETURN 9FT ADLT (ELECTROSURGICAL) ×3
ELECTRODE REM PT RTRN 9FT ADLT (ELECTROSURGICAL) ×1 IMPLANT
EVACUATOR 1/8 PVC DRAIN (DRAIN) IMPLANT
GAUZE SPONGE 4X4 16PLY XRAY LF (GAUZE/BANDAGES/DRESSINGS) ×3 IMPLANT
GLOVE BIOGEL M 8.0 STRL (GLOVE) ×6 IMPLANT
GLOVE BIOGEL PI IND STRL 7.5 (GLOVE) ×1 IMPLANT
GLOVE BIOGEL PI IND STRL 8 (GLOVE) ×1 IMPLANT
GLOVE BIOGEL PI INDICATOR 7.5 (GLOVE) ×2
GLOVE BIOGEL PI INDICATOR 8 (GLOVE) ×2
GLOVE ECLIPSE 7.0 STRL STRAW (GLOVE) ×9 IMPLANT
GLOVE ECLIPSE 7.5 STRL STRAW (GLOVE) ×9 IMPLANT
GLOVE ECLIPSE 8.0 STRL XLNG CF (GLOVE) ×3 IMPLANT
GLOVE EXAM NITRILE LRG STRL (GLOVE) IMPLANT
GLOVE EXAM NITRILE MD LF STRL (GLOVE) IMPLANT
GLOVE EXAM NITRILE XL STR (GLOVE) IMPLANT
GLOVE EXAM NITRILE XS STR PU (GLOVE) IMPLANT
GOWN STRL REUS W/ TWL LRG LVL3 (GOWN DISPOSABLE) ×1 IMPLANT
GOWN STRL REUS W/ TWL XL LVL3 (GOWN DISPOSABLE) ×3 IMPLANT
GOWN STRL REUS W/TWL 2XL LVL3 (GOWN DISPOSABLE) ×3 IMPLANT
GOWN STRL REUS W/TWL LRG LVL3 (GOWN DISPOSABLE) ×2
GOWN STRL REUS W/TWL XL LVL3 (GOWN DISPOSABLE) ×6
KIT BASIN OR (CUSTOM PROCEDURE TRAY) ×3 IMPLANT
KIT INFUSE SMALL (Orthopedic Implant) ×3 IMPLANT
KIT ROOM TURNOVER OR (KITS) ×3 IMPLANT
MILL MEDIUM DISP (BLADE) ×3 IMPLANT
NEEDLE HYPO 18GX1.5 BLUNT FILL (NEEDLE) IMPLANT
NEEDLE HYPO 21X1.5 SAFETY (NEEDLE) ×3 IMPLANT
NEEDLE HYPO 25X1 1.5 SAFETY (NEEDLE) ×3 IMPLANT
NS IRRIG 1000ML POUR BTL (IV SOLUTION) ×3 IMPLANT
PACK LAMINECTOMY NEURO (CUSTOM PROCEDURE TRAY) ×3 IMPLANT
PAD ABD 8X10 STRL (GAUZE/BANDAGES/DRESSINGS) IMPLANT
PAD ARMBOARD 7.5X6 YLW CONV (MISCELLANEOUS) ×9 IMPLANT
PATTIES SURGICAL .5 X1 (DISPOSABLE) IMPLANT
PATTIES SURGICAL .5 X3 (DISPOSABLE) IMPLANT
ROD REVERE 6.35 45MM (Rod) ×6 IMPLANT
SCREW REVERE 5.5X45 (Screw) ×6 IMPLANT
SCREW REVERE 5.5X50 (Screw) ×6 IMPLANT
SPACER SUSTAIN O SML 8X22 12MM (Spacer) ×6 IMPLANT
SPONGE GAUZE 4X4 12PLY (GAUZE/BANDAGES/DRESSINGS) ×3 IMPLANT
SPONGE LAP 4X18 X RAY DECT (DISPOSABLE) IMPLANT
SPONGE NEURO XRAY DETECT 1X3 (DISPOSABLE) IMPLANT
SPONGE SURGIFOAM ABS GEL 100 (HEMOSTASIS) ×3 IMPLANT
STRIP CLOSURE SKIN 1/2X4 (GAUZE/BANDAGES/DRESSINGS) ×2 IMPLANT
SUT ETHILON 2 0 FS 18 (SUTURE) ×3 IMPLANT
SUT VIC AB 1 CT1 18XBRD ANBCTR (SUTURE) ×2 IMPLANT
SUT VIC AB 1 CT1 8-18 (SUTURE) ×4
SUT VIC AB 2-0 CP2 18 (SUTURE) ×3 IMPLANT
SUT VIC AB 3-0 SH 8-18 (SUTURE) ×3 IMPLANT
SYR 20CC LL (SYRINGE) ×3 IMPLANT
SYR 20ML ECCENTRIC (SYRINGE) ×3 IMPLANT
SYR 5ML LL (SYRINGE) IMPLANT
TAPE CLOTH SURG 4X10 WHT LF (GAUZE/BANDAGES/DRESSINGS) ×3 IMPLANT
TOWEL OR 17X24 6PK STRL BLUE (TOWEL DISPOSABLE) ×3 IMPLANT
TOWEL OR 17X26 10 PK STRL BLUE (TOWEL DISPOSABLE) ×3 IMPLANT
TRAY FOLEY CATH 14FRSI W/METER (CATHETERS) IMPLANT
TRAY FOLEY CATH 16FRSI W/METER (SET/KITS/TRAYS/PACK) ×3 IMPLANT
WATER STERILE IRR 1000ML POUR (IV SOLUTION) ×3 IMPLANT

## 2014-03-09 NOTE — Anesthesia Procedure Notes (Addendum)
Procedure Name: Intubation Date/Time: 03/09/2014 12:31 PM Performed by: Lovie Chol Pre-anesthesia Checklist: Patient identified, Emergency Drugs available, Suction available, Patient being monitored and Timeout performed Patient Re-evaluated:Patient Re-evaluated prior to inductionOxygen Delivery Method: Circle system utilized Preoxygenation: Pre-oxygenation with 100% oxygen Intubation Type: IV induction Ventilation: Mask ventilation without difficulty and Oral airway inserted - appropriate to patient size Laryngoscope Size: Miller and 2 Grade View: Grade II Tube type: Oral Tube size: 7.5 mm Number of attempts: 1 Airway Equipment and Method: Stylet Placement Confirmation: ETT inserted through vocal cords under direct vision,  positive ETCO2,  CO2 detector and breath sounds checked- equal and bilateral Secured at: 23 cm Tube secured with: Tape Dental Injury: Teeth and Oropharynx as per pre-operative assessment

## 2014-03-09 NOTE — Transfer of Care (Signed)
Immediate Anesthesia Transfer of Care Note  Patient: Anthony Marsh  Procedure(s) Performed: Procedure(s): Lumbar four-five Posterior lumbar interbody fusion with Bilateral Lumbar three Laminectomy (N/A)  Patient Location: PACU  Anesthesia Type:General  Level of Consciousness: oriented, sedated and patient cooperative  Airway & Oxygen Therapy: Patient Spontanous Breathing and Patient connected to face mask oxygen  Post-op Assessment: Report given to PACU RN and Post -op Vital signs reviewed and stable  Post vital signs: Reviewed  Complications: No apparent anesthesia complications

## 2014-03-09 NOTE — Anesthesia Postprocedure Evaluation (Deleted)
Anesthesia Post Note  Patient: Anthony Marsh  Procedure(s) Performed: Procedure(s) (LRB): Lumbar four-five Posterior lumbar interbody fusion with Bilateral Lumbar three Laminectomy (N/A)  Anesthesia type: general  Patient location: PACU  Post pain: Pain level controlled  Post assessment: Patient's Cardiovascular Status Stable  Last Vitals:  Filed Vitals:   03/09/14 0833  BP: 171/63  Pulse: 57  Temp: 36.6 C  Resp: 20    Post vital signs: Reviewed and stable  Level of consciousness: sedated  Complications: No apparent anesthesia complications

## 2014-03-09 NOTE — Addendum Note (Signed)
Addendum created 03/09/14 1750 by Corky Sox, MD   Modules edited: Orders

## 2014-03-09 NOTE — Anesthesia Preprocedure Evaluation (Addendum)
Anesthesia Evaluation  Patient identified by MRN, date of birth, ID band Patient awake    Reviewed: Allergy & Precautions, H&P , NPO status , Patient's Chart, lab work & pertinent test results, reviewed documented beta blocker date and time   Airway Mallampati: II TM Distance: >3 FB Neck ROM: Limited    Dental  (+) Teeth Intact, Dental Advisory Given   Pulmonary COPDformer smoker,  breath sounds clear to auscultation        Cardiovascular hypertension, Pt. on medications and Pt. on home beta blockers + CAD and + Peripheral Vascular Disease - Cardiac Stents Rhythm:Regular Rate:Normal     Neuro/Psych PSYCHIATRIC DISORDERS Depression  Neuromuscular disease    GI/Hepatic negative GI ROS,   Endo/Other  diabetes, Well ControlledDiet controlled  Renal/GU negative Renal ROS     Musculoskeletal   Abdominal   Peds  Hematology   Anesthesia Other Findings   Reproductive/Obstetrics negative OB ROS                        Anesthesia Physical Anesthesia Plan  ASA: III  Anesthesia Plan: General   Post-op Pain Management:    Induction: Intravenous  Airway Management Planned: Oral ETT  Additional Equipment:   Intra-op Plan:   Post-operative Plan: Extubation in OR  Informed Consent: I have reviewed the patients History and Physical, chart, labs and discussed the procedure including the risks, benefits and alternatives for the proposed anesthesia with the patient or authorized representative who has indicated his/her understanding and acceptance.   Dental advisory given  Plan Discussed with: CRNA and Anesthesiologist  Anesthesia Plan Comments:         Anesthesia Quick Evaluation

## 2014-03-09 NOTE — Anesthesia Postprocedure Evaluation (Signed)
Anesthesia Post Note  Patient: Anthony Marsh  Procedure(s) Performed: Procedure(s) (LRB): Lumbar four-five Posterior lumbar interbody fusion with Bilateral Lumbar three Laminectomy (N/A)  Anesthesia type: general  Patient location: PACU  Post pain: Pain level controlled  Post assessment: Patient's Cardiovascular Status Stable  Last Vitals:  Filed Vitals:   03/09/14 1611  BP:   Pulse:   Temp: 36.5 C  Resp:     Post vital signs: Reviewed and stable  Level of consciousness: sedated  Complications: No apparent anesthesia complications

## 2014-03-09 NOTE — Progress Notes (Signed)
Pt arrived to unit by Lona Kettle at [phone]. Pt drowsy and not able to stay awake for longer than 1 minute. Will try to get up and foley out in early morning. Pt states he is in no pain, oriented to room and call bell. Bed alarm on. Will continue to monitor.

## 2014-03-10 ENCOUNTER — Inpatient Hospital Stay (HOSPITAL_COMMUNITY): Payer: Medicare Other

## 2014-03-10 LAB — GLUCOSE, CAPILLARY
GLUCOSE-CAPILLARY: 213 mg/dL — AB (ref 70–99)
GLUCOSE-CAPILLARY: 128 mg/dL — AB (ref 70–99)
GLUCOSE-CAPILLARY: 136 mg/dL — AB (ref 70–99)
Glucose-Capillary: 144 mg/dL — ABNORMAL HIGH (ref 70–99)

## 2014-03-10 MED ORDER — SODIUM CHLORIDE 0.9 % IV SOLN
INTRAVENOUS | Status: DC
  Administered 2014-03-10 – 2014-03-11 (×2): via INTRAVENOUS

## 2014-03-10 MED ORDER — TAMSULOSIN HCL 0.4 MG PO CAPS
0.8000 mg | ORAL_CAPSULE | Freq: Every day | ORAL | Status: DC
  Administered 2014-03-10 – 2014-03-15 (×6): 0.8 mg via ORAL
  Filled 2014-03-10 (×7): qty 2

## 2014-03-10 NOTE — Evaluation (Signed)
Physical Therapy Evaluation Patient Details Name: Anthony PrimroseManuel Marsh MRN: [id number] DOB: Mar 02, 1923 Today's Date: 03/10/2014   History of Present Illness  80 y.o. male [facility] for elective L3 bil laminectomy and L4/5 PLIF.  He has significant PMHx of PVD (with stents and bypasses), HTN, neuromuscular disorder of left hand (weakness), depression, DM 2, bil rotator cuff surgeries, cervical spine degeneration, bil carpal tunnel release.    Clinical Impression  Pt is POD #1 and is moving well, although safety and impulsivity are issues.  The RW seemed to be more of a barrier today and his unsafe use of it may put him at higher risk of falls with it than without it.  We may try a cane tomorrow.  He is moving well enough that he will likely not need home therapy at d/c as long as safety is better in future sessions.       Follow Up Recommendations No PT follow up;Supervision for mobility/OOB    Equipment Recommendations  Rolling walker with 5" wheels;Cane (RW vs cane TBD)    Recommendations for Other Services   NA    Precautions / Restrictions Precautions Precautions: Back;Fall Precaution Booklet Issued: Yes (comment) Precaution Comments: pt really unable to verbalize any back precautions, reinforced education provided by OT Restrictions Weight Bearing Restrictions: No      Mobility  Bed Mobility Overal bed mobility: Needs Assistance Bed Mobility: Rolling;Sidelying to Sit Rolling: Min assist Sidelying to sit: Min assist       General bed mobility comments: Min assist to support trunk to get all the way to sidelying.  Pt using bed rail for leverage, Assist at trunk to help with transition from side lying to sitting EOB.  Verbal cues for log roll technique and to avoid twisting during transitions.   Transfers Overall transfer level: Needs assistance Equipment used: Rolling walker (2 wheeled) Transfers: Sit to/from Stand Sit to Stand: Min assist         General  transfer comment: Min assist to support trunk to get to standing.  Verbal cues for safe hand placement. Verbal cues to slow down and make sure to sit for a minute before going to standing.   Ambulation/Gait Ambulation/Gait assistance: Min assist Ambulation Distance (Feet): 100 Feet Assistive device: Rolling walker (2 wheeled) Gait Pattern/deviations: Step-through pattern;Staggering left;Staggering right;Trunk flexed Gait velocity: too fast for safety, impulsive, running into things with the RW   General Gait Details: Pt is moving quickly and impulsively with RW.  He is having difficulty steering it and staying inside of it during gait.  He ran it into the bed and the wall and the door frame during gait. The walker may be more of a hendrance and it may be beneficial to try a cane next session.   Stairs Stairs: Yes Stairs assistance: Supervision Stair Management: Two rails;Alternating pattern;Forwards Number of Stairs: 5 General stair comments: Pt able to preform stairs without signs of leg buckling.  Supervision for safety for line management as pt continues to move very quickly.           Balance Overall balance assessment: Needs assistance Sitting-balance support: Feet supported Sitting balance-Leahy Scale: Good     Standing balance support: Bilateral upper extremity supported Standing balance-Leahy Scale: Poor                               Pertinent Vitals/Pain See vitals flow sheet.     Home  Living Family/patient expects to be discharged to:: Private residence Living Arrangements: Children (lives with daughter) Available Help at Discharge: Family;Available 24 hours/day Type of Home: House Home Access: Stairs to enter Entrance Stairs-Rails: Lawyer of Steps: 4 Home Layout: One level Home Equipment: None      Prior Function Level of Independence: Needs assistance      ADL's / Homemaking Assistance Needed: Unclear how much  assistance pt required due to poor historian and difficulty understanding pt. Pt reports his daughter assists with his socks and shoes but he is independent for most ADLs. (per OT report)        Hand Dominance   Dominant Hand: Right    Extremity/Trunk Assessment   Upper Extremity Assessment: Defer to OT evaluation       LUE Deficits / Details: shoulder flexion to about 90 degrees, reports that is baseline   Lower Extremity Assessment: Generalized weakness (per functional assessment at least 3+/5 bil )      Cervical / Trunk Assessment: Other exceptions  Communication   Communication: Prefers language other than English (English is not his primary language)  Cognition Arousal/Alertness: Awake/alert Behavior During Therapy: Impulsive Overall Cognitive Status: No family/caregiver present to determine baseline cognitive functioning       Memory: Decreased recall of precautions                       Assessment/Plan    PT Assessment Patient needs continued PT services  PT Diagnosis Difficulty walking;Abnormality of gait;Generalized weakness;Acute pain   PT Problem List Decreased strength;Decreased activity tolerance;Decreased balance;Decreased mobility;Decreased knowledge of use of DME;Decreased knowledge of precautions;Decreased safety awareness;Pain  PT Treatment Interventions DME instruction;Gait training;Stair training;Functional mobility training;Therapeutic activities;Therapeutic exercise;Balance training;Neuromuscular re-education;Patient/family education;Modalities   PT Goals (Current goals can be found in the Care Plan section) Acute Rehab PT Goals Patient Stated Goal: to go home, decrease pain PT Goal Formulation: With patient Time For Goal Achievement: 03/17/14 Potential to Achieve Goals: Good    Frequency Min 5X/week    End of Session Equipment Utilized During Treatment: Gait belt Activity Tolerance: Patient tolerated treatment well Patient left: in  chair;with call bell/phone within reach;with chair alarm set           Time: 6384-6659 PT Time Calculation (min): 19 min   Charges:   PT Evaluation $Initial PT Evaluation Tier I: 1 Procedure PT Treatments $Gait Training: 8-22 mins        Cortnee Steinmiller B. Urban [employer], PT, DPT [phone]   03/10/2014, 12:28 PM

## 2014-03-10 NOTE — Progress Notes (Signed)
Removed foley catheter at 0600 after pt ambulated in room. Pt tolerated removal well. Pt then ambulated with one assist and with a walker to the front of unit and back to his room. Pt tolerated well. Will continue to monitor.

## 2014-03-10 NOTE — Clinical Social Work Note (Signed)
Referred to this CSW today for ?SNF. Chart reviewed and have noted PT/OT recommendations- patient will not require SNF- CSW to sign off- please contact us if SW needs arise. Reece Levy, MSW, Theresia Majors [phone]

## 2014-03-10 NOTE — Progress Notes (Signed)
Patient ID: Anthony Marsh, male   DOB: 1923-05-05, 80 y.o.   MRN: [id number] Bladder ultrasound unremarkable. Voided on his own

## 2014-03-10 NOTE — Progress Notes (Signed)
Utilization review completed.  

## 2014-03-10 NOTE — Progress Notes (Signed)
Patient ID: Anthony Marsh, male   DOB: [birth year], 80 y.o.   MRN: [id number] Unable to void. See orders.

## 2014-03-10 NOTE — Progress Notes (Signed)
PT/OT notes reviewed and patient did very well with evaluations.   PT/OT feels that he will likely not need any follow up therapies past discharge. Will defer CIR consult as anticipate patient reaching supervision level.

## 2014-03-10 NOTE — Progress Notes (Signed)
According to NT, patient's gown and sheets were wet when he came back from Ultrasound. Foley insertion put on hold  as at now. Incoming staff will be notified to keep monitoring if foley is needed later.

## 2014-03-10 NOTE — Progress Notes (Signed)
Occupational Therapy Evaluation Patient Details Name: Mazon Smedley MRN: [id number] DOB: 04/22/1923 Today's Date: 03/10/2014    History of Present Illness 80 y.o. male admitted to [facility] on 03/09/14 for elective L3 bil laminectomy and L4/5 PLIF.  He has significant PMHx of PVD (with stents and bypasses), HTN, neuromuscular disorder of left hand (weakness), depression, DM 2, bil rotator cuff surgeries, cervical spine degeneration, bil carpal tunnel release.     Clinical Impression   PTA pt lived at home with his daughter. It is unclear how much assistance he required for ADLs and functional mobility, however he reports that his daughter assisted with socks and shoes. Pt speaks and writes [place], but it is his second language. Pt would benefit from continued OT for family education for back precautions and safety with ADLs.     Follow Up Recommendations  No OT follow up;Supervision/Assistance - 24 hour    Equipment Recommendations  None recommended by OT       Precautions / Restrictions Precautions Precautions: Back;Fall Precaution Booklet Issued: Yes (comment) Precaution Comments: educated pt on 3/3 back precautions and incorporating into ADLs. Restrictions Weight Bearing Restrictions: No      Mobility Bed Mobility Overal bed mobility: Needs Assistance Bed Mobility: Rolling;Sidelying to Sit;Sit to Sidelying Rolling: Min assist Sidelying to sit: Min assist     Sit to sidelying: Min assist General bed mobility comments: Min (A) to assist pt to roll onto side and to support trunk to sitting. Verbal and tactile cues for log roll technique.   Transfers Overall transfer level: Needs assistance Equipment used: Rolling walker (2 wheeled) Transfers: Sit to/from Stand Sit to Stand: Min assist         General transfer comment: Min (A) to stabilize RW as pt powered up. Verbal cues for hand placement, sequencing, and safety. Pt impulsive with transfers and ambulation.    Balance  Overall balance assessment: Needs assistance Sitting-balance support: Feet supported Sitting balance-Leahy Scale: Good     Standing balance support: Bilateral upper extremity supported Standing balance-Leahy Scale: Poor                              ADL Overall ADL's : Needs assistance/impaired Eating/Feeding: Independent;Sitting   Grooming: Supervision/safety;Set up;Sitting   Upper Body Bathing: Supervision/ safety;Set up;Sitting   Lower Body Bathing: Minimal assistance;Sit to/from stand;Cueing for back precautions   Upper Body Dressing : Set up;Supervision/safety;Sitting   Lower Body Dressing: Minimal assistance;Sit to/from stand;Cueing for back precautions   Toilet Transfer: Minimal assistance;Ambulation;Comfort height toilet;RW   Toileting- Clothing Manipulation and Hygiene: Minimal assistance;Cueing for back precautions;Sit to/from stand   Tub/ Shower Transfer: Walk-in shower;Minimal assistance;Ambulation;Rolling walker   Functional mobility during ADLs: Minimal assistance;Rolling walker General ADL Comments: Pt presents with impulsivity and difficulty recalling back precautions following education. Provided verbal instructions and demonstrations of 3/3 back precautions. Pt stood using RW at side of bed and flopped back onto bed without warning with no attempt to catch self. Guarded pt more closely following incident before returning to bed using log roll technique. Pt able to cross legs to reach feet for LB ADLs. Education and training provided for incorporating back precautions into ADLs, fall prevention strategies, and home safety.      Vision  Per pt report, no change from baseline.                   Perception Perception Perception Tested?: No   Praxis Praxis Praxis tested?:  Within functional limits    Pertinent Vitals/Pain Pt c/o soreness in surgical site, however unable to provide score level.      Hand Dominance Right   Extremity/Trunk  Assessment Upper Extremity Assessment Upper Extremity Assessment: LUE deficits/detail LUE Deficits / Details: shoulder flexion to about 90 degrees, reports that is baseline   Lower Extremity Assessment Lower Extremity Assessment: Defer to PT evaluation   Cervical / Trunk Assessment Cervical / Trunk Assessment: Normal Cervical / Trunk Exceptions: h/o cervical spinal stenosis listed in PMhx   Communication Communication Communication: Other (comment);Prefers language other than English (pt able to speak/read English but difficult to understand at times)   Cognition Arousal/Alertness: Awake/alert Behavior During Therapy: Impulsive Overall Cognitive Status: No family/caregiver present to determine baseline cognitive functioning       Memory: Decreased recall of precautions;Decreased short-term memory                        Home Living Family/patient expects to be discharged to:: Private residence Living Arrangements: Children (lives with daughter) Available Help at Discharge: Family;Available 24 hours/day Type of Home: House Home Access: Stairs to enter Entergy CorporationEntrance Stairs-Number of Steps: 4 Entrance Stairs-Rails: Left;Right Home Layout: One level     Bathroom Shower/Tub: Producer, television/film/videoWalk-in shower   Bathroom Toilet: Standard     Home Equipment: None          Prior Functioning/Environment Level of Independence: Needs assistance    ADL's / Homemaking Assistance Needed: Unclear how much assistance pt required due to poor historian and difficulty understanding pt. Pt reports his daughter assists with his socks and shoes but he is independent for most ADLs.        OT Diagnosis: Generalized weakness;Acute pain   OT Problem List: Decreased strength;Decreased range of motion;Decreased activity tolerance;Impaired balance (sitting and/or standing);Decreased safety awareness;Decreased knowledge of use of DME or AE;Decreased knowledge of precautions;Pain   OT Treatment/Interventions:  Self-care/ADL training;Therapeutic exercise;Energy conservation;DME and/or AE instruction;Therapeutic activities;Patient/family education;Balance training    OT Goals(Current goals can be found in the care plan section) Acute Rehab OT Goals Patient Stated Goal: to return home OT Goal Formulation: With patient Time For Goal Achievement: 03/17/14 Potential to Achieve Goals: Good ADL Goals Pt Will Perform Grooming: with supervision;standing Pt Will Perform Lower Body Bathing: with supervision;sit to/from stand Pt Will Perform Lower Body Dressing: with supervision;sit to/from stand Pt Will Transfer to Toilet: with supervision;ambulating;regular height toilet Pt Will Perform Toileting - Clothing Manipulation and hygiene: with supervision;sit to/from stand Pt Will Perform Tub/Shower Transfer: Shower transfer;with supervision;ambulating;rolling walker Additional ADL Goal #1: Pt will independently recall 3/3 back precautions for safety during ADLs and functional mobility.   OT Frequency: Min 2X/week              End of Session Equipment Utilized During Treatment: Gait belt;Rolling walker  Activity Tolerance: Patient tolerated treatment well Patient left: in bed;with call bell/phone within reach;with bed alarm set   Time: 0922-0950 OT Time Calculation (min): 28 min Charges:  OT General Charges $OT Visit: 1 Procedure OT Evaluation $Initial OT Evaluation Tier I: 1 Procedure OT Treatments $Self Care/Home Management : 8-22 mins  Rae LipsLeeann M Karene Bracken [phone] 03/10/2014, 1:29 PM

## 2014-03-10 NOTE — Op Note (Signed)
NAMEMarland Kitchen  JAFFET, SILSBY NO.:  [id number]  MEDICAL RECORD NO.:  [id number]  LOCATION:  [id number]                        FACILITY:  [facility]  PHYSICIAN:  Hilda Lias, M.D.   DATE OF BIRTH:  May 03, 1923  DATE OF PROCEDURE:  03/09/2014 DATE OF DISCHARGE:                              OPERATIVE REPORT   PREOPERATIVE DIAGNOSES:  L3-L4 stenosis, L4-L5 stenosis secondary to spondylolisthesis.  Neurogenic claudication.  POSTOPERATIVE DIAGNOSES:  L3-L4 stenosis, L4-5 stenosis secondary to spondylolisthesis.  Neurogenic claudication.  PROCEDURE:  Bilateral L3 laminectomy to decompress the thecal sac and foraminotomy.  Bilateral L4 laminectomy with bilateral facetectomy. Bilateral L4-L5 diskectomy beyond normal to be able to introduce 2 cages of 12 x 26.  Decompression of the thecal sac as well as the L4-L5 nerve root.  Pedicle screws at L4-L5, posterolateral arthrodesis with autograft and BMP, Cell Saver, C-arm.  SURGEON:  Hilda Lias, M.D.  ASSISTANT:  Dr. Gerlene Fee.  CLINICAL HISTORY:  The patient in the past underwent surgery in the legs and hands.  Now, he had been complaining of weakness in both legs, being unable to walk more than 200 feet because the pain gets worse.  Sitting improved the pain.  X-rays showed that he has spondylolisthesis at L4-5 and severe stenosis at the L3-4.  Surgery was advised.  The patient is quite active, mentally alert, quite independent.  He and his daughter who _was with him_________ knew the risks and benefits of the surgery.  PROCEDURE:  The patient was taken to the OR, and after intubation, he was positioned in prone manner.  The back was cleaned with DuraPrep and drapes were applied.  Midline incision from L2-3 down to L4-L5 was made and muscle retracted all the way laterally.  We took several x-rays. After we identified the area, we removed the spinous process of L3 and L4, we started over the lamina of L3-L4.  We decompressed the  L3-4 area with plenty of space at the L3 nerve root.  Then, at the level of L4-L5, not only we removed the lamina but the facet.  Having done this, we were able to retract the nerve root lateral to one side and do total gross diskectomy and then to the opposite side with total gross diskectomy with plenty of space.  The endplate were drilled.  Then, 2 cages of 12 x 26 were introduced.  The cages had BMP in the anterior part as well as autograft.  The rest of the disk space was filled out using the autograft and BMP.  From then on, using the C-arm in AP view and then a lateral view, we were able to make holes in the pedicles at those 4 levels.  The patient has slight upper lumbar__________ scoliosis.  Prior to introducing the screws, we were able to feel to be sure that we were surrounded by bone in all quadrants.  The screws were 45-50 with a 5.5 diameter.  The screws were connected with the rod and Capps.  From then on, we went lateral and we removed the periosteum of the lateral aspect of the facet and transverse process of L4-5 and a mix of BMP and autograft was used for arthrodesis.  The area was irrigated.  Valsalva maneuver was done at least 5 times and was negative.  Then, the wound was closed with Vicryl in 3 different layers and nylon.          ______________________________ Hilda LiasErnesto Ayansh Feutz, M.D.     EB/MEDQ  D:  03/09/2014  T:  03/10/2014  Job:  [id number]

## 2014-03-10 NOTE — Progress Notes (Signed)
Patient ID: Anthony Marsh, male   DOB: 23-Jul-1923, 80 y.o.   MRN: [id number] Doing well. C/o incisional pain. No weakness. Ambulating with help

## 2014-03-10 NOTE — Progress Notes (Signed)
Thank you for consult on Anthony Marsh. Chart reviewed and PT/OT evaluations pending. Nursing notes indicate that patient was able to walk short distances with nursing this am. Will follow up past therapy evaluations to help determine appropriate rehab needs.

## 2014-03-10 NOTE — Progress Notes (Signed)
Rn did assessment on him after the 8-hour long period of foley removal. Bladder scan only measured 248 ml being the highest. Attempted to insert foley per MD but to no avail. Foley has  Blood return instead of urine. MD notified again, vitals were checked orders written to do Renal ultrasound, iv fluids rate increased, and a foley has been order to monitor after the ultrasound. Will continue to monitor.

## 2014-03-11 LAB — GLUCOSE, CAPILLARY
GLUCOSE-CAPILLARY: 131 mg/dL — AB (ref 70–99)
Glucose-Capillary: 116 mg/dL — ABNORMAL HIGH (ref 70–99)
Glucose-Capillary: 124 mg/dL — ABNORMAL HIGH (ref 70–99)
Glucose-Capillary: 151 mg/dL — ABNORMAL HIGH (ref 70–99)
Glucose-Capillary: 124 mg/dL — ABNORMAL HIGH (ref 70–99)

## 2014-03-11 MED ORDER — PNEUMOCOCCAL VAC POLYVALENT 25 MCG/0.5ML IJ INJ
0.5000 mL | INJECTION | INTRAMUSCULAR | Status: DC
  Filled 2014-03-11: qty 0.5

## 2014-03-11 NOTE — Progress Notes (Signed)
Nutrition Brief Note  Patient identified on the Malnutrition Screening Tool (MST) Report  Wt Readings from Last 15 Encounters:  03/09/14 152 lb 3 oz (69.032 kg)  03/09/14 152 lb 3 oz (69.032 kg)  02/24/14 152 lb 3.2 oz (69.037 kg)  01/13/14 157 lb (71.215 kg)  01/05/14 156 lb (70.761 kg)  08/18/13 153 lb (69.4 kg)  03/13/13 154 lb (69.854 kg)  12/04/12 155 lb 9.6 oz (70.58 kg)  11/13/12 160 lb 12.8 oz (72.938 kg)  11/10/12 161 lb (73.029 kg)  11/04/12 160 lb 0.9 oz (72.6 kg)  11/04/12 160 lb 0.9 oz (72.6 kg)  10/31/12 156 lb (70.761 kg)  10/31/12 156 lb (70.761 kg)  10/01/12 142 lb 6.7 oz (64.6 kg)    Body mass index is 23.15 kg/(m^2). Patient meets criteria for Normal Weight based on current BMI. Pt reports that PTA he was eating less due to pain causing 15 lb weight loss. No evidence of 15 lb weight loss per weight history.   Current diet order is Carb Modified, patient is consuming approximately 90% of meals at this time. Pt states that his appetite is much better and he is eating very well.  He denies any nutrition needs or questions at this time. Labs and medications reviewed.   No nutrition interventions warranted at this time. If nutrition issues arise, please consult RD.   Ian Malkin RD, LDN Inpatient Clinical Dietitian Pager: [phone] After Hours Pager: [phone]

## 2014-03-11 NOTE — Progress Notes (Signed)
Physical Therapy Treatment Patient Details Name: Dollene PrimroseManuel Woodfin MRN: [id number] DOB: [birth year] Today's Date: 03/11/2014    History of Present Illness 80 y.o. male [facility] for elective L3 bil laminectomy and L4/5 PLIF.  He has significant PMHx of PVD (with stents and bypasses), HTN, neuromuscular disorder of left hand (weakness), depression, DM 2, bil rotator cuff surgeries, cervical spine degeneration, bil carpal tunnel release.      PT Comments    Pt is POD #2 and is moving mildly better.  He continues to be impulsive with his use of RW, but better than yesterday's session. He reports his daughter is coming to stay with him 24/7 at d/c.  He has no carryover of precautions (we may need to print a Spanish version of education handout).  He is min assist overall with gait and supervision on the stairs with rails.  PT will continue to follow acutely.    Follow Up Recommendations  Home health PT;Supervision for mobility/OOB     Equipment Recommendations  Rolling walker with 5" wheels    Recommendations for Other Services   NA     Precautions / Restrictions Precautions Precautions: Back;Fall Precaution Booklet Issued: Yes (comment) (yesterday) Precaution Comments: Reinforced 3 back precautions, brace use, lifting restrictions, activity education.  Pt unable to report any back precautions back to me.  Required Braces or Orthoses: Spinal Brace Spinal Brace: Lumbar corset;Applied in sitting position    Mobility  Bed Mobility Overal bed mobility: Needs Assistance Bed Mobility: Rolling;Sidelying to Sit Rolling: Min assist Sidelying to sit: Supervision       General bed mobility comments: Min assist to get pt rolled all the way over to side lying.  Pt using railing for support.  supervision for side lying to sit today with verbal cues to avoid twisting  Transfers Overall transfer level: Needs assistance Equipment used: Rolling walker (2 wheeled) Transfers: Sit  to/from Stand Sit to Stand: Min guard         General transfer comment: Min guard assist for safety as pt is coming up slowly over flexed knees.  Max verbal cues for safe hand placement during sit to stand transitions.   Ambulation/Gait Ambulation/Gait assistance: Min assist Ambulation Distance (Feet): 100 Feet Assistive device: Rolling walker (2 wheeled) Gait Pattern/deviations: Step-through pattern;Shuffle;Trunk flexed Gait velocity: pt with better speed control today   General Gait Details: Pt mildly better at steering and moving slowly in the hallway. He needs constat reinforcement to stay inside of his RW and stand tall.  Most support needed to help him steer the walker in room   Stairs Stairs: Yes Stairs assistance: Supervision Stair Management: Two rails;Alternating pattern;Forwards Number of Stairs: 5 General stair comments: Verbal cues for safety (put his entire foot on the step)         Balance Overall balance assessment: Needs assistance Sitting-balance support: Feet supported Sitting balance-Leahy Scale: Good     Standing balance support: Bilateral upper extremity supported Standing balance-Leahy Scale: Poor                      Cognition Arousal/Alertness: Awake/alert Behavior During Therapy: Impulsive Overall Cognitive Status: No family/caregiver present to determine baseline cognitive functioning       Memory: Decreased recall of precautions;Decreased short-term memory                     Pertinent Vitals/Pain See vitals flow sheet.  PT Goals (current goals can now be found in the care plan section) Acute Rehab PT Goals Patient Stated Goal: to return home Progress towards PT goals: Progressing toward goals    Frequency  Min 5X/week    PT Plan Discharge plan needs to be updated       End of Session Equipment Utilized During Treatment: Gait belt;Back brace Activity Tolerance: Patient tolerated treatment  well Patient left: in chair;with call bell/phone within reach;with chair alarm set     Time: 9784-7841 PT Time Calculation (min): 19 min  Charges:  $Gait Training: 8-22 mins                      Myrtle Barnhard B. Calyn Rubi, PT, DPT [phone]   03/11/2014, 12:19 PM

## 2014-03-11 NOTE — Progress Notes (Signed)
Came into the room patient is hallucinating and having conversations with people that are not in the room.  Lance Bosch, RN

## 2014-03-11 NOTE — Progress Notes (Signed)
Patient ID: Anthony Marsh, male   DOB: 1923/08/16, 80 y.o.   MRN: [id number] Voiding. C/o constipation. Wound dry. Rehabilitation to see him

## 2014-03-11 NOTE — Progress Notes (Signed)
03/11/2014  PT spoke with RN who has been communicating with the pt's daughter.  Despite pt's reports that his daughter will help him at d/c she reports she will be unable to provide 24/7 care that he needs to be safe to go home.  He is mobilizing well, but he has poor safety and poor carryover of precautions.  He is appropriate for SNF placement at d/c.  PT will continue to follow acutely to help with mobility and d/c planning.  Thanks,  Rollene Rotunda. Link Burgeson, PT, DPT [phone]

## 2014-03-12 LAB — GLUCOSE, CAPILLARY
GLUCOSE-CAPILLARY: 131 mg/dL — AB (ref 70–99)
GLUCOSE-CAPILLARY: 182 mg/dL — AB (ref 70–99)
Glucose-Capillary: 94 mg/dL (ref 70–99)
Glucose-Capillary: 174 mg/dL — ABNORMAL HIGH (ref 70–99)

## 2014-03-12 MED FILL — Sodium Chloride IV Soln 0.9%: INTRAVENOUS | Qty: 1000 | Status: AC

## 2014-03-12 MED FILL — Heparin Sodium (Porcine) Inj 1000 Unit/ML: INTRAMUSCULAR | Qty: 30 | Status: AC

## 2014-03-12 NOTE — Care Management Note (Signed)
    Page 1 of 1   03/12/2014     10:24:11 AM CARE MANAGEMENT NOTE 03/12/2014  Patient:  Anthony Marsh, Anthony Marsh   Account Number:  [id number]  Date Initiated:  03/12/2014  Documentation initiated by:  Lorne Skeens  Subjective/Objective Assessment:   Patient admitted for L3-L4 stenosis, L4-5 stenosis secondary to  spondylolisthesis.  Neurogenic claudication.     Action/Plan:   Will follow for discharge needs pending PT/OT evals and physician orders.   Anticipated DC Date:     Anticipated DC Plan:  SKILLED NURSING FACILITY         Choice offered to / List presented to:             Status of service:   Medicare Important Message given?  YES (If response is "NO", the following Medicare IM given date fields will be blank) Date Medicare IM given:  02/24/2014 Date Additional Medicare IM given:  03/12/2014  Discharge Disposition:    Per UR Regulation:  Reviewed for med. necessity/level of care/duration of stay  If discussed at [place] of Stay Meetings, dates discussed:    Comments:  03/12/14 [place], MSN, CM- Met with patient to provide Medicare IM letter.

## 2014-03-12 NOTE — Progress Notes (Signed)
Occupational Therapy Treatment Patient Details Name: Anthony Marsh MRN: [id number] DOB: 09/17/1923 Today's Date: 03/12/2014    History of present illness 80 y.o. male admitted to [facility] on 03/09/14 for elective L3 bil laminectomy and L4/5 PLIF.  He has significant PMHx of PVD (with stents and bypasses), HTN, neuromuscular disorder of left hand (weakness), depression, DM 2, bil rotator cuff surgeries, cervical spine degeneration, bil carpal tunnel release.     OT comments  Pt seen for ADL session and functional mobility. Pt lethargic and requiring increased (A) today for transfers. Due to pt's current assist level, am recommending SNF for ST rehab for increased independence prior to d/c home. Pt would continue to benefit from acute OT to address ADL needs.    Follow Up Recommendations  SNF;Supervision/Assistance - 24 hour    Equipment Recommendations  None recommended by OT       Precautions / Restrictions Precautions Precautions: Back;Fall Precaution Comments: Reinforced 3/3 back precautions and emphasized that they are not exercises, but motions that he should NOT be doing.   Required Braces or Orthoses: Spinal Brace Spinal Brace: Lumbar corset;Applied in sitting position Restrictions Weight Bearing Restrictions: No       Mobility Bed Mobility Overal bed mobility: Needs Assistance Bed Mobility: Rolling;Sidelying to Sit Rolling: Min assist Sidelying to sit: Mod assist     Sit to sidelying: Mod assist General bed mobility comments: Pt having difficulty initiating and sequencing log roll and sidelying>sit. Mod (A) for trunk support to sitting EOB.   Transfers Overall transfer level: Needs assistance Equipment used: Rolling walker (2 wheeled) Transfers: Sit to/from [employer] Sit to Stand: Mod assist Stand pivot transfers: Mod assist       General transfer comment: Mod assist to get to standing from sitting on bed with RW.  Pt falling posteriorly multiple  times with mod assist to maintain forward balance over his feet.         ADL Overall ADL's : Needs assistance/impaired Eating/Feeding: Independent;Sitting   Grooming: Supervision/safety;Set up;Sitting   Upper Body Bathing: Sitting;Min guard   Lower Body Bathing: Moderate assistance;Sitting/lateral leans;Adhering to back precautions   Upper Body Dressing : Sitting;Moderate assistance (including back brace)   Lower Body Dressing: Maximal assistance;Sit to/from stand   Toilet Transfer: Moderate assistance;Stand-pivot;BSC;RW       Tub/ Shower Transfer: Total assistance     General ADL Comments: Pt continues to be impulsive and may now be demonstrating confusion. Poor carryover with precaution education. I have increased safety concerns with him returning home at current level without 24/7 supervision. Performed sit>stand x3 and pt fatigued quickly.                 Cognition  Arousal/Alertness: Lethargic Behavior During Therapy: Impulsive Overall Cognitive Status: No family/caregiver present to determine baseline cognitive functioning Area of Impairment: Attention;Following commands;Safety/judgement;Awareness   Current Attention Level: Sustained Memory: Decreased recall of precautions  Following Commands: Follows one step commands inconsistently Safety/Judgement: Decreased awareness of safety;Decreased awareness of deficits Awareness: Intellectual   General Comments: I concur with PT (Becca) that it is difficult to determine whether there is confusion or language barrier, but pt is not carrying over education on precautions, RW use, and continues to be impulsive. He cannot accurately report pain or weakness and answers some questions inappropriately.                  Pertinent Vitals/ Pain       Pt unable to accurately describe pain.  Frequency Min 2X/week     Progress Toward Goals  OT Goals(current goals can now be found in the care plan section)   Progress towards OT goals: Not progressing toward goals - comment (pt requiring increased (A) today)     Plan Discharge plan needs to be updated       End of Session Equipment Utilized During Treatment: Gait belt;Rolling walker;Back brace   Activity Tolerance Patient tolerated treatment well   Patient Left in bed;with call bell/phone within reach;with bed alarm set           Time: [id number] OT Time Calculation (min): 18 min  Charges: OT General Charges $OT Visit: 1 Procedure OT Treatments $Self Care/Home Management : 8-22 mins  Anthony Marsh [phone] 03/12/2014, 5:08 PM

## 2014-03-12 NOTE — Progress Notes (Signed)
Patient ID: Anthony Marsh, male   DOB: [birth year], 81 y.o.   MRN: [id number] Foley re-inserted. Decrease of pain. Looking for a SNF

## 2014-03-13 LAB — GLUCOSE, CAPILLARY
GLUCOSE-CAPILLARY: 119 mg/dL — AB (ref 70–99)
Glucose-Capillary: 98 mg/dL (ref 70–99)
Glucose-Capillary: 110 mg/dL — ABNORMAL HIGH (ref 70–99)
Glucose-Capillary: 141 mg/dL — ABNORMAL HIGH (ref 70–99)
Glucose-Capillary: 107 mg/dL — ABNORMAL HIGH (ref 70–99)

## 2014-03-13 NOTE — Progress Notes (Signed)
Patient ID: Anthony Marsh, male   DOB: Sep 18, 1923, 80 y.o.   MRN: [id number] Doing okay pain seems to be a little worse last this morning his back but no radicular symptoms  Neurologically stable  Awaiting rehabilitation

## 2014-03-14 LAB — GLUCOSE, CAPILLARY
GLUCOSE-CAPILLARY: 201 mg/dL — AB (ref 70–99)
Glucose-Capillary: 103 mg/dL — ABNORMAL HIGH (ref 70–99)
Glucose-Capillary: 120 mg/dL — ABNORMAL HIGH (ref 70–99)

## 2014-03-14 NOTE — Progress Notes (Signed)
Clinical Child psychotherapist (CSW) left message with patient's daughter Byrd Hesselbach to discuss D/C plan. CSW will continue to follow.   Jetta Lout, LCSWA Weekend CSW [phone]

## 2014-03-14 NOTE — Progress Notes (Signed)
Clinical Social Work Department BRIEF PSYCHOSOCIAL ASSESSMENT 03/14/2014  Patient:  Anthony Marsh, Anthony Marsh     Account Number:  [id number]     Admit date:  03/09/2014  Clinical Social Worker:  Hendricks Milo  Date/Time:  03/14/2014 03:40 PM  Referred by:  Physician  Date Referred:  03/12/2014 Referred for  SNF Placement   Other Referral:   Interview type:  Family Other interview type:    PSYCHOSOCIAL DATA Living Status:  WITH ADULT CHILDREN Admitted from facility:   Level of care:   Primary support name:  Anthony Marsh [phone] Primary support relationship to patient:  CHILD, ADULT Degree of support available:   Good support.    CURRENT CONCERNS  Other Concerns:    SOCIAL WORK ASSESSMENT / PLAN Clinical Social Worker (CSW) contacted patient's daughter Anthony Marsh to discuss D/C plan. Anthony Marsh reported that patient lives with her and she cares for him. Daughter is agreeable to SNF search and prefers a facility in [place] or [place].   Assessment/plan status:  Psychosocial Support/Ongoing Assessment of Needs Other assessment/ plan:   Information/referral to community resources:    PATIENT'S/FAMILY'S RESPONSE TO PLAN OF CARE: Daughter thanked CSW for calling and assisting with placement process.

## 2014-03-14 NOTE — Progress Notes (Signed)
Clinical Social Work Department CLINICAL SOCIAL WORK PLACEMENT NOTE 03/14/2014  Patient:  EDSEL, BURUCA  Account Number:  [id number] Admit date:  03/09/2014  Clinical Social Worker:  Jetta Lout, Theresia Majors  Date/time:  03/14/2014 03:38 PM  Clinical Social Work is seeking post-discharge placement for this patient at the following level of care:   SKILLED NURSING   (*CSW will update this form in Epic as items are completed)   03/14/2014  Patient/family provided with [facility] Department of Clinical Social Work's list of facilities offering this level of care within the geographic area requested by the patient (or if unable, by the patient's family).  03/14/2014  Patient/family informed of their freedom to choose among providers that offer the needed level of care, that participate in Medicare, Medicaid or managed care program needed by the patient, have an available bed and are willing to accept the patient.  03/14/2014  Patient/family informed of [facility]' ownership interest in [facility], as well as of the fact that they are under no obligation to receive care at this facility.  PASARR submitted to EDS on 03/14/2014 PASARR number received from EDS on 03/14/2014  FL2 transmitted to all facilities in geographic area requested by pt/family on  03/14/2014 FL2 transmitted to all facilities within larger geographic area on   Patient informed that his/her managed care company has contracts with or will negotiate with  certain facilities, including the following:     Patient/family informed of bed offers received:   Patient chooses bed at  Physician recommends and patient chooses bed at    Patient to be transferred to  on   Patient to be transferred to facility by   The following physician request were entered in Epic:   Additional Comments:

## 2014-03-14 NOTE — Progress Notes (Signed)
Patient ID: Anthony Marsh, male   DOB: 1923/01/19, 80 y.o.   MRN: [id number] BP 124/78  Pulse 70  Temp(Src) 98 F (36.7 C) (Oral)  Resp 18  Ht 5\' 8"  (1.727 m)  Wt 69.032 kg (152 lb 3 oz)  BMI 23.15 kg/m2  SpO2 98% Alert and oriented x 4, speech is clear, and fluent Wound dressing is dry, intact Moving lower extremities well Awaiting placement. Will discontinue the condom cAtheter.

## 2014-03-15 LAB — GLUCOSE, CAPILLARY
GLUCOSE-CAPILLARY: 89 mg/dL (ref 70–99)
Glucose-Capillary: 72 mg/dL (ref 70–99)
Glucose-Capillary: 120 mg/dL — ABNORMAL HIGH (ref 70–99)
Glucose-Capillary: 124 mg/dL — ABNORMAL HIGH (ref 70–99)

## 2014-03-15 MED ORDER — BISACODYL 10 MG RE SUPP: 10.0000 mg | Freq: Every day | RECTAL | Status: DC | PRN

## 2014-03-15 NOTE — Progress Notes (Signed)
Pt. DC'd via [facility] to [facility].  Report called and given to RN.  Pt. Vital signs and assessments were stable prior to discharge.

## 2014-03-15 NOTE — Progress Notes (Signed)
Patient ID: Anthony Marsh, male   DOB: 09/30/1923, 80 y.o.   MRN: [id number] Better, less pain. No weakness. Looking for placement

## 2014-03-15 NOTE — Progress Notes (Signed)
Physical Therapy Treatment Patient Details Name: Anthony Marsh MRN: [id number] DOB: 1922-11-01 Today's Date: 03/15/2014    History of Present Illness 80 y.o. male admitted to [facility] on 03/09/14 for elective L3 bil laminectomy and L4/5 PLIF.  He has significant PMHx of PVD (with stents and bypasses), HTN, neuromuscular disorder of left hand (weakness), depression, DM 2, bil rotator cuff surgeries, cervical spine degeneration, bil carpal tunnel release.      PT Comments    Pt is progressing better today than last session.  He continues to need assist and cues for safety with mobility and gait.  He is more alert and oriented, but has safety and awareness issues.  Pt is progressing, but plan is still SNF for rehab as he does not have assist needed to be safe and home at this time.  PT will continue to follow acutely.   Follow Up Recommendations  SNF     Equipment Recommendations  Rolling walker with 5" wheels    Recommendations for Other Services   NA     Precautions / Restrictions Precautions Precautions: Back;Fall Required Braces or Orthoses: Spinal Brace Spinal Brace: Lumbar corset;Applied in sitting position Restrictions Weight Bearing Restrictions: No    Mobility  Bed Mobility Overal bed mobility: Needs Assistance Bed Mobility: Rolling;Sidelying to Sit Rolling: Min assist Sidelying to sit: Min assist       General bed mobility comments: Min assist today to support trunk during transitions.  Verbal cues again to reinforce log roll technique to get up OOB.   Transfers Overall transfer level: Needs assistance Equipment used: Rolling walker (2 wheeled) Transfers: Sit to/from Stand Sit to Stand: Min guard         General transfer comment: Min guard assist to support trunk during transitions.  Pt moving better today less assist needed compared to last session  Ambulation/Gait Ambulation/Gait assistance: Min assist Ambulation Distance (Feet): 100 Feet Assistive device:  Rolling walker (2 wheeled) Gait Pattern/deviations: Step-through pattern;Shuffle;Trunk flexed Gait velocity: decreased Gait velocity interpretation: Below normal speed for age/gender General Gait Details: Pt with continued safety issues with RW.  Verbal and manual assist needed to keep pt close enough to RW for support and upright posture.  Assist also needed to help him steer the RW down the hall and even more assist need in tighter spaces like the gym or his room.    Stairs Stairs: Yes Stairs assistance: Min assist Stair Management: Two rails;Step to pattern;Forwards Number of Stairs: 5 General stair comments: Min assist for safety as pt shows signs of increased leg weakness on the stairs (increased knee flexion)         Balance Overall balance assessment: Needs assistance Sitting-balance support: Feet supported Sitting balance-Leahy Scale: Good     Standing balance support: Bilateral upper extremity supported Standing balance-Leahy Scale: Poor                      Cognition Arousal/Alertness: Awake/alert Behavior During Therapy: Impulsive (but less so than last week) Overall Cognitive Status: No family/caregiver present to determine baseline cognitive functioning Area of Impairment: Memory;Safety/judgement;Awareness     Memory: Decreased recall of precautions     Awareness: Emergent   General Comments: Pt better today, but continues to have safety and awareness issues.  Better at command following and generally seems more alert and oriented than friday 5/29    Exercises General Exercises - Lower Extremity Long Arc Quad: AROM;Both;10 reps;Seated Hip Flexion/Marching: AROM;Both;10 reps;Seated Toe Raises: AROM;Both;10 reps;Seated Heel Raises:  AROM;Both;10 reps;Seated        Pertinent Vitals/Pain See vitals flow sheet.            PT Goals (current goals can now be found in the care plan section) Acute Rehab PT Goals Patient Stated Goal: to go  home Progress towards PT goals: Progressing toward goals    Frequency  Min 5X/week    PT Plan Current plan remains appropriate       End of Session Equipment Utilized During Treatment: Back brace Activity Tolerance: Patient limited by fatigue Patient left: in chair;with call bell/phone within reach;with chair alarm set     Time: [id number] PT Time Calculation (min): 20 min  Charges:  $Gait Training: 8-22 mins                      Karuna Balducci B. Sajad Glander, PT, DPT [phone]   03/15/2014, 11:47 AM

## 2014-03-15 NOTE — Discharge Summary (Signed)
Physician Discharge Summary  Patient ID: Anthony Marsh MRN: [id number] DOB/AGE: 05-03-1923 80 y.o.  Admit date: 03/09/2014 Discharge date: 03/15/2014  Admission Diagnoses:lumbar stenosis,spondylolisthesis  Discharge Diagnoses:  Active Problems:   Spondylolisthesis of lumbar region   Discharged Condition: less pain with ambulation  Hospital Course: decompression and fusion lumbar spine  Consults: rehabilitation medicine  Significant Diagnostic Studies:myelogram  Treatment   Lumbar fusion at l4-5 with pedicles screws  Discharge Exam: Blood pressure 104/42, pulse 62, temperature 97.6 F (36.4 C), temperature source Oral, resp. rate 18, height 5\' 8"  (1.727 m), weight 152 lb 3 oz (69.032 kg), SpO2 98.00%. Wound dry, ambulating. Voiding, no weakness  Disposition: SNF     Medication List    ASK your doctor about these medications       carvedilol 6.25 MG tablet  Commonly known as:  COREG  Take 6.25 mg by mouth 2 (two) times daily with a meal. Pt takes this medication at 8 am and 8 pm     donepezil 10 MG tablet  Commonly known as:  ARICEPT  Take 1 tablet (10 mg total) by mouth daily.     gabapentin 300 MG capsule  Commonly known as:  NEURONTIN  Take 300 mg by mouth 3 (three) times daily.     lisinopril 10 MG tablet  Commonly known as:  PRINIVIL,ZESTRIL  Take 10 mg by mouth daily.     omeprazole 40 MG capsule  Commonly known as:  PRILOSEC  Take 40 mg by mouth 2 (two) times daily.     sertraline 100 MG tablet  Commonly known as:  ZOLOFT  Take 100 mg by mouth daily.     simvastatin 40 MG tablet  Commonly known as:  ZOCOR  Take 40 mg by mouth daily.     TIGER BALM ULTRA STRENGTH Oint  Apply 1 application topically 2 (two) times daily as needed (leg pain).         Signed: Karn Cassis 03/15/2014, 12:19 PM

## 2014-03-16 ENCOUNTER — Ambulatory Visit: Payer: Self-pay | Admitting: Neurology

## 2014-04-09 NOTE — OR Nursing (Signed)
Addendum to scope page 

## 2014-07-29 ENCOUNTER — Ambulatory Visit (HOSPITAL_COMMUNITY): Payer: Medicare Other | Admitting: Psychiatry

## 2014-07-30 ENCOUNTER — Ambulatory Visit (HOSPITAL_COMMUNITY): Payer: Medicare Other | Admitting: Psychiatry

## 2014-09-23 ENCOUNTER — Encounter (HOSPITAL_COMMUNITY): Payer: Self-pay | Admitting: Cardiovascular Disease

## 2014-10-28 ENCOUNTER — Other Ambulatory Visit: Payer: Self-pay | Admitting: Neurology

## 2014-10-29 ENCOUNTER — Other Ambulatory Visit: Payer: Self-pay | Admitting: Neurology

## 2014-11-02 ENCOUNTER — Other Ambulatory Visit: Payer: Self-pay | Admitting: Neurology

## 2014-11-03 NOTE — Telephone Encounter (Signed)
Patient has not been seen in over 1 year.  I called, they will check calendar and call us back to schedule appt.

## 2014-12-02 ENCOUNTER — Telehealth: Payer: Self-pay | Admitting: Neurology

## 2014-12-02 NOTE — Telephone Encounter (Signed)
Spoke to Anthony Marsh - appt scheduled for 12/06/14.

## 2014-12-02 NOTE — Telephone Encounter (Signed)
Pt's daughter is calling pt is running out of medication and needs a sooner appt than 4/7 that I offered.  Wants to see if he can be worked in or see of Rx can be transferred to PCP.  He has an appt with Dr. Yetta FlockHodges on 2/29.  Please call and advise.

## 2014-12-06 ENCOUNTER — Ambulatory Visit: Payer: Self-pay | Admitting: Neurology

## 2014-12-13 ENCOUNTER — Telehealth (HOSPITAL_COMMUNITY): Payer: Self-pay | Admitting: *Deleted

## 2014-12-14 ENCOUNTER — Encounter: Payer: Self-pay | Admitting: Neurology

## 2014-12-14 ENCOUNTER — Ambulatory Visit (INDEPENDENT_AMBULATORY_CARE_PROVIDER_SITE_OTHER): Payer: Medicare Other | Admitting: Neurology

## 2014-12-14 VITALS — BP 136/62 | HR 64 | Ht 68.0 in | Wt 156.0 lb

## 2014-12-14 DIAGNOSIS — R413 Other amnesia: Secondary | ICD-10-CM

## 2014-12-14 DIAGNOSIS — M4806 Spinal stenosis, lumbar region: Secondary | ICD-10-CM

## 2014-12-14 DIAGNOSIS — M48062 Spinal stenosis, lumbar region with neurogenic claudication: Secondary | ICD-10-CM

## 2014-12-14 MED ORDER — DONEPEZIL HCL 10 MG PO TABS: 10.0000 mg | ORAL_TABLET | Freq: Every day | ORAL | Status: AC

## 2014-12-14 MED ORDER — MEMANTINE HCL 10 MG PO TABS: 10.0000 mg | ORAL_TABLET | Freq: Two times a day (BID) | ORAL | Status: AC
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
## 2014-12-15 ENCOUNTER — Telehealth (HOSPITAL_COMMUNITY): Payer: Self-pay | Admitting: *Deleted

## 2014-12-15 DIAGNOSIS — E782 Mixed hyperlipidemia: Secondary | ICD-10-CM | POA: Diagnosis not present

## 2014-12-15 DIAGNOSIS — I10 Essential (primary) hypertension: Secondary | ICD-10-CM | POA: Diagnosis not present

## 2014-12-15 DIAGNOSIS — G301 Alzheimer's disease with late onset: Secondary | ICD-10-CM | POA: Diagnosis not present

## 2014-12-15 DIAGNOSIS — F0281 Dementia in other diseases classified elsewhere with behavioral disturbance: Secondary | ICD-10-CM | POA: Diagnosis not present

## 2014-12-15 DIAGNOSIS — E1151 Type 2 diabetes mellitus with diabetic peripheral angiopathy without gangrene: Secondary | ICD-10-CM | POA: Diagnosis not present

## 2018-07-15 DEATH — deceased
